# Patient Record
Sex: Female | Born: 1950 | Race: White | Hispanic: No | Marital: Married | Smoking: Former smoker
Health system: Southern US, Community
[De-identification: ages and names within clinical notes are randomized; demographics above are authoritative.]

## PROBLEM LIST (undated history)

## (undated) DIAGNOSIS — K219 Gastro-esophageal reflux disease without esophagitis: Secondary | ICD-10-CM

## (undated) DIAGNOSIS — C4492 Squamous cell carcinoma of skin, unspecified: Secondary | ICD-10-CM

## (undated) DIAGNOSIS — I10 Essential (primary) hypertension: Secondary | ICD-10-CM

## (undated) HISTORY — PX: ABDOMINAL HYSTERECTOMY: SHX81

## (undated) HISTORY — PX: COLONOSCOPY WITH PROPOFOL: SHX5780

---

## 2014-10-01 ENCOUNTER — Ambulatory Visit: Payer: Self-pay | Admitting: Family Medicine

## 2015-04-08 DIAGNOSIS — K219 Gastro-esophageal reflux disease without esophagitis: Secondary | ICD-10-CM | POA: Insufficient documentation

## 2015-04-08 DIAGNOSIS — Z800 Family history of malignant neoplasm of digestive organs: Secondary | ICD-10-CM | POA: Insufficient documentation

## 2015-04-11 ENCOUNTER — Other Ambulatory Visit: Payer: Self-pay | Admitting: Family Medicine

## 2015-04-11 DIAGNOSIS — Z780 Asymptomatic menopausal state: Secondary | ICD-10-CM

## 2015-04-29 ENCOUNTER — Ambulatory Visit
Admission: RE | Admit: 2015-04-29 | Discharge: 2015-04-29 | Disposition: A | Discharge status: Home / Self Care | Payer: Medicare Other | Source: Ambulatory Visit | Attending: Family Medicine | Admitting: Family Medicine

## 2015-04-29 DIAGNOSIS — Z780 Asymptomatic menopausal state: Secondary | ICD-10-CM | POA: Diagnosis present

## 2016-01-06 DIAGNOSIS — R079 Chest pain, unspecified: Secondary | ICD-10-CM | POA: Insufficient documentation

## 2016-03-14 ENCOUNTER — Ambulatory Visit
Admission: EM | Admit: 2016-03-14 | Discharge: 2016-03-14 | Disposition: A | Discharge status: Home / Self Care | Payer: Medicare Other | Attending: Family Medicine | Admitting: Family Medicine

## 2016-03-14 ENCOUNTER — Encounter: Payer: Self-pay | Admitting: Emergency Medicine

## 2016-03-14 ENCOUNTER — Ambulatory Visit: Payer: Medicare Other

## 2016-03-14 DIAGNOSIS — M79602 Pain in left arm: Secondary | ICD-10-CM | POA: Diagnosis not present

## 2016-03-14 DIAGNOSIS — M25552 Pain in left hip: Secondary | ICD-10-CM | POA: Insufficient documentation

## 2016-03-14 DIAGNOSIS — M549 Dorsalgia, unspecified: Secondary | ICD-10-CM | POA: Diagnosis present

## 2016-03-14 DIAGNOSIS — M79605 Pain in left leg: Secondary | ICD-10-CM

## 2016-03-14 DIAGNOSIS — W19XXXA Unspecified fall, initial encounter: Secondary | ICD-10-CM

## 2016-03-14 HISTORY — DX: Essential (primary) hypertension: I10

## 2016-03-14 HISTORY — DX: Gastro-esophageal reflux disease without esophagitis: K21.9

## 2016-03-14 MED ORDER — ORPHENADRINE CITRATE ER 100 MG PO TB12: 100.0000 mg | ORAL_TABLET | Freq: Two times a day (BID) | ORAL | 0 refills | Status: DC

## 2016-03-14 MED ORDER — MELOXICAM 15 MG PO TABS: 15.0000 mg | ORAL_TABLET | Freq: Every day | ORAL | 0 refills | Status: DC
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
## 2017-01-01 ENCOUNTER — Ambulatory Visit
Admission: EM | Admit: 2017-01-01 | Discharge: 2017-01-01 | Disposition: A | Discharge status: Home / Self Care | Payer: Medicare Other | Attending: Registered Nurse | Admitting: Registered Nurse

## 2017-01-01 DIAGNOSIS — L03116 Cellulitis of left lower limb: Secondary | ICD-10-CM

## 2017-01-01 DIAGNOSIS — L03119 Cellulitis of unspecified part of limb: Secondary | ICD-10-CM

## 2017-01-01 HISTORY — DX: Squamous cell carcinoma of skin, unspecified: C44.92

## 2017-01-01 MED ORDER — ACETAMINOPHEN 500 MG PO TABS: 1000.0000 mg | ORAL_TABLET | Freq: Four times a day (QID) | ORAL | 0 refills | Status: DC | PRN

## 2017-01-01 MED ORDER — CEPHALEXIN 500 MG PO CAPS: 500.0000 mg | ORAL_CAPSULE | Freq: Three times a day (TID) | ORAL | 0 refills | Status: DC

## 2017-01-01 NOTE — ED Provider Notes (Signed)
CSN: [id number]     Arrival date & time 01/01/17  1240 History   First MD Initiated Contact with Patient 01/01/17 1316     Chief Complaint  Patient presents with  . Wound Infection   (Consider location/radiation/quality/duration/timing/severity/associated sxs/prior Treatment) 66y/o caucasian female established patient here for evaluation swelling, tenderness left lower extremity that started last night and swelling worsened a great deal overnight.  Patient planning to travel out of town for vacation and [facility] not open weekends.  Has not tried any treatment except to wash with soap and water.  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  Patient reported increased pink/red color to skin.  Denied trauma, insect bites, scratching, itching to affected area.  Calf does not hurt with movement only shin area.  Denied discharge, fever, nausea, vomiting, diarrhea, headache, dsypnea, dysuria, history of seasonal allergies/infections resistent to antibiotics      Past Medical History:  Diagnosis Date  . GERD (gastroesophageal reflux disease)   . Hypertension   . Squamous cell skin cancer    Past Surgical History:  Procedure Laterality Date  . ABDOMINAL HYSTERECTOMY     History reviewed. No pertinent family history. Social History  Substance Use Topics  . Smoking status: Former Smoker    Quit date: 01/02/2007  . Smokeless tobacco: Never Used  . Alcohol use Yes     Comment: couple times weekly   OB History    No data available     Review of Systems  Constitutional: Negative for activity change, appetite change, chills, diaphoresis, fatigue and fever.  HENT: Negative for ear pain and sore throat.   Eyes: Negative for pain and visual disturbance.  Respiratory: Negative for cough, shortness of breath, wheezing and stridor.   Cardiovascular: Positive for leg swelling. Negative for chest pain and palpitations.  Gastrointestinal: Negative for blood in stool and  vomiting.  Endocrine: Negative for cold intolerance and heat intolerance.  Genitourinary: Negative for dysuria and hematuria.  Musculoskeletal: Positive for myalgias. Negative for arthralgias, back pain, gait problem, joint swelling, neck pain and neck stiffness.  Skin: Positive for color change and rash. Negative for pallor and wound.  Allergic/Immunologic: Negative for environmental allergies and food allergies.  Neurological: Negative for tremors, seizures, syncope, weakness and numbness.  Hematological: Negative for adenopathy. Does not bruise/bleed easily.  Psychiatric/Behavioral: Negative for sleep disturbance.  All other systems reviewed and are negative.   Allergies  Patient has no known allergies.  Home Medications   Prior to Admission medications   Medication Sig Start Date End Date Taking? Authorizing Provider  acetaminophen (TYLENOL) 500 MG tablet Take 2 tablets (1,000 mg total) by mouth every 6 (six) hours as needed. 01/01/17   Tanaysia Bhardwaj, Aura Fey, NP  cephALEXin (KEFLEX) 500 MG capsule Take 1 capsule (500 mg total) by mouth 3 (three) times daily. 01/01/17   Clydie Dillen, Aura Fey, NP  lisinopril-hydrochlorothiazide (PRINZIDE,ZESTORETIC) 20-12.5 MG tablet Take 1 tablet by mouth daily.    [provider]  omeprazole (PRILOSEC) 20 MG capsule Take 20 mg by mouth 2 (two) times daily before a meal.    [provider]  orphenadrine (NORFLEX) 100 MG tablet Take 1 tablet (100 mg total) by mouth 2 (two) times daily. 03/14/16   Frederich Cha, MD  pravastatin (PRAVACHOL) 40 MG tablet Take 40 mg by mouth daily.    [provider]   Meds Ordered and Administered this Visit  Medications - No data to display  BP 129/61 (BP Location:  Right Arm)   Pulse 67   Temp 98.4 F (36.9 C) (Oral)   Resp 16   Ht 5\' 5"  (1.651 m)   Wt 130 lb (59 kg)   SpO2 100%   BMI 21.63 kg/m  No data found.   Physical Exam  Constitutional: She is oriented to person, place, and time.  Vital signs are normal. She appears well-developed and well-nourished. She is active and cooperative.  Non-toxic appearance. She does not have a sickly appearance. She does not appear ill. No distress.  HENT:  Head: Normocephalic and atraumatic.  Right Ear: Hearing and external ear normal.  Left Ear: Hearing and external ear normal.  Nose: Nose normal.  Mouth/Throat: Uvula is midline, oropharynx is clear and moist and mucous membranes are normal. No oropharyngeal exudate.  Eyes: Conjunctivae, EOM and lids are normal. Pupils are equal, round, and reactive to light. Right eye exhibits no discharge. Left eye exhibits no discharge. No scleral icterus.  Neck: Trachea normal, normal range of motion and phonation normal. Neck supple. No neck rigidity. No tracheal deviation, no edema and no erythema present.  Cardiovascular: Normal rate, regular rhythm and intact distal pulses.   No murmur heard. Pulses:      Dorsalis pedis pulses are 2+ on the right side, and 2+ on the left side.  Pulmonary/Chest: Effort normal and breath sounds normal. No stridor. No respiratory distress. She has no wheezes.  Abdominal: Soft. There is no tenderness. There is no guarding.  Musculoskeletal:       Right shoulder: Normal.       Left shoulder: Normal.       Right elbow: Normal.      Left elbow: Normal.       Right wrist: Normal.       Left wrist: Normal.       Right hip: Normal.       Left hip: Normal.       Right knee: Normal.       Left knee: Normal.       Right ankle: Normal.       Left ankle: Normal.       Cervical back: Normal.       Thoracic back: Normal.       Right forearm: Normal.       Left forearm: Normal.       Right hand: Normal.       Left hand: Normal.       Right lower leg: Normal.       Left lower leg: She exhibits tenderness, swelling and edema. She exhibits no bony tenderness, no deformity and no laceration.       Legs:      Right foot: Normal.       Left foot: Normal.  1cm erythematous  macular rash anterior left shin 3cm superior to malleolus 0-1+ swelling nonpitting; superiomedial to erythema is localized edema fluctuant TTP 2cm diameter nonerythematous; scar noted central of erythema flat hypopigmented well healed  Neurological: She is alert and oriented to person, place, and time. She has normal strength. She is not disoriented. She displays no atrophy, no tremor and normal reflexes. No cranial nerve deficit or sensory deficit. She exhibits normal muscle tone. She displays no seizure activity. Coordination and gait normal. GCS eye subscore is 4. GCS verbal subscore is 5. GCS motor subscore is 6.  Gait sure and steady in hall; strength extremities 5/5 bilaterally  Skin: Skin is warm and dry. Capillary refill takes less than  2 seconds. Rash noted. No abrasion, no bruising, no burn, no ecchymosis, no laceration, no lesion, no petechiae and no purpura noted. Rash is macular. Rash is not papular, not maculopapular, not nodular, not pustular, not vesicular and not urticarial. She is not diaphoretic. There is erythema. No cyanosis. No pallor. Nails show no clubbing.     Psychiatric: She has a normal mood and affect. Her speech is normal and behavior is normal. Judgment and thought content normal. She is not actively hallucinating. Cognition and memory are normal. She is attentive.  Nursing note and vitals reviewed.   Urgent Care Course     Procedures (including critical care time)  Labs Review Labs Reviewed - No data to display  Imaging Review No results found.    MDM   1. Cellulitis of lower extremity, unspecified laterality    Keflex 500mg  po TID x 7 days extend to 14 days if all swelling/erythema has not resolved with 7 days but improving with treatment.  Elevate left leg when sitting.  May apply ice 15 minutes QID prn pain/swelling.  Consider compression.  Will treat for cellulitis.  Exitcare handout on skin infection given to patient.  RTC if worsening erythema, pain,  purulent discharge, fever after 48 hours on antibiotics or if overnight redness spreads past knee go to ER as may need IV antibiotics.  Wash towels, washcloths, sheets in hot water with bleach every couple of days until infection resolved.  Avoid soaking limb in dirty water e.g. Muddy/rivers/streams until infection healed.  Discussed signs and symptoms of blood clots with patient e.g. Calf pain, knotty swelling, pain along blood vessel.  Patient verbalized understanding, agreed with plan of care and had no further questions at this time.      Olen Cordial, NP 01/01/17 1430

## 2017-01-01 NOTE — ED Triage Notes (Signed)
Pt reports squamous cell surgery to her left shin. Healing well until yesterday when she noticed redness around the site which seems to be moving up the leg.

## 2017-01-28 ENCOUNTER — Other Ambulatory Visit: Payer: Self-pay | Admitting: Family Medicine

## 2017-01-28 DIAGNOSIS — Z1239 Encounter for other screening for malignant neoplasm of breast: Secondary | ICD-10-CM

## 2017-01-28 DIAGNOSIS — Z780 Asymptomatic menopausal state: Secondary | ICD-10-CM

## 2017-05-02 ENCOUNTER — Ambulatory Visit
Admission: RE | Admit: 2017-05-02 | Discharge: 2017-05-02 | Disposition: A | Discharge status: Home / Self Care | Payer: Medicare Other | Source: Ambulatory Visit | Attending: Family Medicine | Admitting: Family Medicine

## 2017-05-02 DIAGNOSIS — Z780 Asymptomatic menopausal state: Secondary | ICD-10-CM | POA: Insufficient documentation

## 2017-05-02 DIAGNOSIS — Z1231 Encounter for screening mammogram for malignant neoplasm of breast: Secondary | ICD-10-CM | POA: Diagnosis present

## 2017-05-02 DIAGNOSIS — M810 Age-related osteoporosis without current pathological fracture: Secondary | ICD-10-CM | POA: Diagnosis not present

## 2017-05-02 DIAGNOSIS — Z1239 Encounter for other screening for malignant neoplasm of breast: Secondary | ICD-10-CM

## 2017-09-28 DIAGNOSIS — Z87891 Personal history of nicotine dependence: Secondary | ICD-10-CM | POA: Insufficient documentation

## 2017-09-28 DIAGNOSIS — R911 Solitary pulmonary nodule: Secondary | ICD-10-CM | POA: Insufficient documentation

## 2018-04-03 ENCOUNTER — Other Ambulatory Visit: Payer: Self-pay | Admitting: Family Medicine

## 2018-04-03 DIAGNOSIS — Z1231 Encounter for screening mammogram for malignant neoplasm of breast: Secondary | ICD-10-CM

## 2018-05-04 ENCOUNTER — Ambulatory Visit
Admission: RE | Admit: 2018-05-04 | Discharge: 2018-05-04 | Disposition: A | Discharge status: Home / Self Care | Payer: Medicare Other | Source: Ambulatory Visit | Attending: Family Medicine | Admitting: Family Medicine

## 2018-05-04 ENCOUNTER — Encounter (INDEPENDENT_AMBULATORY_CARE_PROVIDER_SITE_OTHER): Payer: Self-pay

## 2018-05-04 DIAGNOSIS — Z1231 Encounter for screening mammogram for malignant neoplasm of breast: Secondary | ICD-10-CM

## 2018-08-19 ENCOUNTER — Ambulatory Visit
Admission: EM | Admit: 2018-08-19 | Discharge: 2018-08-19 | Disposition: A | Discharge status: Home / Self Care | Payer: Medicare Other | Attending: Emergency Medicine | Admitting: Emergency Medicine

## 2018-08-19 ENCOUNTER — Other Ambulatory Visit: Payer: Self-pay

## 2018-08-19 ENCOUNTER — Encounter: Payer: Self-pay | Admitting: Emergency Medicine

## 2018-08-19 DIAGNOSIS — B373 Candidiasis of vulva and vagina: Secondary | ICD-10-CM | POA: Diagnosis present

## 2018-08-19 DIAGNOSIS — R310 Gross hematuria: Secondary | ICD-10-CM | POA: Diagnosis present

## 2018-08-19 DIAGNOSIS — B3731 Acute candidiasis of vulva and vagina: Secondary | ICD-10-CM

## 2018-08-19 LAB — WET PREP, GENITAL
Clue Cells Wet Prep HPF POC: NONE SEEN
Sperm: NONE SEEN
Trich, Wet Prep: NONE SEEN

## 2018-08-19 LAB — URINALYSIS, COMPLETE (UACMP) WITH MICROSCOPIC
BILIRUBIN URINE: NEGATIVE
Bacteria, UA: NONE SEEN
GLUCOSE, UA: NEGATIVE mg/dL
KETONES UR: NEGATIVE mg/dL
LEUKOCYTES UA: NEGATIVE
Nitrite: NEGATIVE
PROTEIN: NEGATIVE mg/dL
Specific Gravity, Urine: 1.015 (ref 1.005–1.030)
pH: 7 (ref 5.0–8.0)

## 2018-08-19 MED ORDER — FLUCONAZOLE 150 MG PO TABS: 150.0000 mg | ORAL_TABLET | Freq: Once | ORAL | 1 refills | Status: AC

## 2018-08-19 MED ORDER — PHENAZOPYRIDINE HCL 200 MG PO TABS: 200.0000 mg | ORAL_TABLET | Freq: Three times a day (TID) | ORAL | 0 refills | Status: DC | PRN

## 2018-08-19 NOTE — ED Triage Notes (Signed)
Patient c/o burning when urinating that started yesterday.  Patient reports some blood on her toilet paper after she wiped.

## 2018-08-19 NOTE — ED Provider Notes (Signed)
HPI  SUBJECTIVE:  Lori Dougherty is a 68 y.o. female who presents with 3 weeks of burning dysuria, frequency, odorous urine.  She notes gross hematuria starting yesterday with the first urine in the morning.  She reports intermittent midline pelvic pain described as cramping, discomfort.  Pain is not associated with urination.  She saw her PMD 3 weeks ago for this on 1/13, she had trace blood in her urine, but no gross hematuria.  She was treated with 3 days of Macrobid and her urine culture came back negative.  The Macrobid helped temporarily.  No aggravating factors.  She denies nausea, vomiting, fevers, abdominal, back pain.  No vaginal bleeding, odor, discharge, labial swelling, vaginal itching, irritation.  She did get a labial biopsy on 1/28 without any complications.  She is sexually active with her husband of 32 years who is asymptomatic.  STDs are not a concern today.  She is a former smoker and is status post a hysterectomy.  She has a history of UTIs, hypertension, vaginal dryness she has not filled topical estrogen yet.  No history of bladder cancer, pyelonephritis, nephrolithiasis, diabetes.  Family history significant for mother with nephrolithiasis.  CVE:LFYBOF, Frederic Jericho, MD   Past Medical History:  Diagnosis Date  . GERD (gastroesophageal reflux disease)   . Hypertension   . Squamous cell skin cancer     Past Surgical History:  Procedure Laterality Date  . ABDOMINAL HYSTERECTOMY      Family History  Problem Relation Age of Onset  . Breast cancer Neg Hx     Social History   Tobacco Use  . Smoking status: Former Smoker    Last attempt to quit: 01/02/2007    Years since quitting: 11.6  . Smokeless tobacco: Never Used  Substance Use Topics  . Alcohol use: Yes    Comment: couple times weekly  . Drug use: No    No current facility-administered medications for this encounter.   Current Outpatient Medications:  .  lisinopril-hydrochlorothiazide  (PRINZIDE,ZESTORETIC) 20-12.5 MG tablet, Take 1 tablet by mouth daily., Disp: , Rfl:  .  omeprazole (PRILOSEC) 20 MG capsule, Take 20 mg by mouth 2 (two) times daily before a meal., Disp: , Rfl:  .  pravastatin (PRAVACHOL) 40 MG tablet, Take 40 mg by mouth daily., Disp: , Rfl:  .  acetaminophen (TYLENOL) 500 MG tablet, Take 2 tablets (1,000 mg total) by mouth every 6 (six) hours as needed., Disp: 30 tablet, Rfl: 0 .  fluconazole (DIFLUCAN) 150 MG tablet, Take 1 tablet (150 mg total) by mouth once for 1 dose. 1 tab po x 1. May repeat in 72 hours if no improvement, Disp: 2 tablet, Rfl: 1 .  phenazopyridine (PYRIDIUM) 200 MG tablet, Take 1 tablet (200 mg total) by mouth 3 (three) times daily as needed for pain., Disp: 6 tablet, Rfl: 0  No Known Allergies   ROS  As noted in HPI.   Physical Exam  BP 99/63 (BP Location: Left Arm)   Pulse 76   Temp 97.9 F (36.6 C) (Oral)   Resp 14   Ht 5' 5.5" (1.664 m)   Wt 59 kg   SpO2 100%   BMI 21.30 kg/m   Constitutional: Well developed, well nourished, no acute distress Eyes:  EOMI, conjunctiva normal bilaterally HENT: Normocephalic, atraumatic,mucus membranes moist Respiratory: Normal inspiratory effort Cardiovascular: Normal rate GI: nondistended, active bowel sounds.  Positive suprapubic and mild bilateral lower quadrant tenderness.  No flank tenderness.  No guarding, rebound.  Negative tap table test. Back: No CVAT GU: Healing biopsy wound left external labia.  No active bleeding, crusting surrounding erythema, edema.  Normal urethra.  Normal vaginal mucosa.  Vaginal cuff intact.  Positive white vaginal d/c in the vaginal vault.   Skin: No rash, skin intact Musculoskeletal: no deformities Neurologic: Alert & oriented x 3, no focal neuro deficits Psychiatric: Speech and behavior appropriate   ED Course   Medications - No data to display  Orders Placed This Encounter  Procedures  . Pelvic exam    Standing Status:   Standing     Number of Occurrences:   1  . Urine culture    Standing Status:   Standing    Number of Occurrences:   1    Order Specific Question:   List patient's active antibiotics    Answer:   none  . Wet prep, genital    Standing Status:   Standing    Number of Occurrences:   1  . Urinalysis, Complete w Microscopic    Standing Status:   Standing    Number of Occurrences:   1    Results for orders placed or performed during the hospital encounter of 08/19/18 (from the past 24 hour(s))  Urinalysis, Complete w Microscopic     Status: Abnormal   Collection Time: 08/19/18  9:43 AM  Result Value Ref Range   Color, Urine STRAW (A) YELLOW   APPearance CLEAR CLEAR   Specific Gravity, Urine 1.015 1.005 - 1.030   pH 7.0 5.0 - 8.0   Glucose, UA NEGATIVE NEGATIVE mg/dL   Hgb urine dipstick MODERATE (A) NEGATIVE   Bilirubin Urine NEGATIVE NEGATIVE   Ketones, ur NEGATIVE NEGATIVE mg/dL   Protein, ur NEGATIVE NEGATIVE mg/dL   Nitrite NEGATIVE NEGATIVE   Leukocytes, UA NEGATIVE NEGATIVE   Squamous Epithelial / LPF 0-5 0 - 5   WBC, UA 0-5 0 - 5 WBC/hpf   RBC / HPF 11-20 0 - 5 RBC/hpf   Bacteria, UA NONE SEEN NONE SEEN  Wet prep, genital     Status: Abnormal   Collection Time: 08/19/18 10:30 AM  Result Value Ref Range   Yeast Wet Prep HPF POC PRESENT (A) NONE SEEN   Trich, Wet Prep NONE SEEN NONE SEEN   Clue Cells Wet Prep HPF POC NONE SEEN NONE SEEN   WBC, Wet Prep HPF POC FEW (A) NONE SEEN   Sperm NONE SEEN    No results found.  ED Clinical Impression  Gross hematuria  Vaginal yeast infection   ED Assessment/Plan  Care Everywhere records, labs reviewed.  As noted in HPI.  UA today negative for bacteria, nitrite, esterase, positive for moderate blood which is a change from the UA 3 weeks ago done in her PMDs office which showed trace blood.  Will send this off for culture to confirm absence of UTI.  checking wet prep for BV and yeast as this could explain her dysuria.  Vaginal dryness  also in the differential.    Unsure as to the etiology of her hematuria, doubt obstructing nephrolithiasis.  Bladder cancer in the differential given history of smoking.  Will refer to urology. Dr. Bernardo Heater on call.   Wet Prep positive for yeast.  Negative for BV. Home with Diflucan.  Also Pyridium for symptom control.  Call patient if urine culture comes back positive for UTI will call in the appropriate antibiotics at that time.  Discussed labs,MDM, treatment plan, and plan for follow-up with patient. Discussed  sn/sx that should prompt return to the ED. patient agrees with plan.   Meds ordered this encounter  Medications  . fluconazole (DIFLUCAN) 150 MG tablet    Sig: Take 1 tablet (150 mg total) by mouth once for 1 dose. 1 tab po x 1. May repeat in 72 hours if no improvement    Dispense:  2 tablet    Refill:  1  . phenazopyridine (PYRIDIUM) 200 MG tablet    Sig: Take 1 tablet (200 mg total) by mouth 3 (three) times daily as needed for pain.    Dispense:  6 tablet    Refill:  0    *This clinic note was created using [employer]. Therefore, there may be occasional mistakes despite careful proofreading.   ?    Melynda Ripple, MD 08/19/18 1746

## 2018-08-19 NOTE — Discharge Instructions (Signed)
Your wet prep was positive for yeast, negative for bacterial vaginosis.  Yeast infection could explain the burning dysuria that you are having.  I am going to treat you with Diflucan.  We will also treat you with Pyridium which may help control your symptoms.  It will turn your urine bright orange.  Continue pushing plenty of fluids.  Follow-up with urology as soon as you possibly can.  Go to the ER for the signs and symptoms we discussed.

## 2018-08-21 ENCOUNTER — Telehealth (HOSPITAL_COMMUNITY): Payer: Self-pay | Admitting: Emergency Medicine

## 2018-08-21 LAB — URINE CULTURE: Culture: NO GROWTH

## 2018-08-21 NOTE — Telephone Encounter (Signed)
Candida (yeast) is positive.  Prescription for fluconazole was given at the urgent care visit.  Urine culture did not suggest a UTI.  Recheck or followup with PCP for further evaluation if symptoms are not improving

## 2018-08-23 ENCOUNTER — Ambulatory Visit (INDEPENDENT_AMBULATORY_CARE_PROVIDER_SITE_OTHER): Payer: Medicare Other | Admitting: Urology

## 2018-08-23 ENCOUNTER — Encounter: Payer: Self-pay | Admitting: Urology

## 2018-08-23 VITALS — BP 151/69 | HR 66 | Ht 65.0 in | Wt 129.6 lb

## 2018-08-23 DIAGNOSIS — L409 Psoriasis, unspecified: Secondary | ICD-10-CM | POA: Insufficient documentation

## 2018-08-23 DIAGNOSIS — I10 Essential (primary) hypertension: Secondary | ICD-10-CM | POA: Insufficient documentation

## 2018-08-23 DIAGNOSIS — E785 Hyperlipidemia, unspecified: Secondary | ICD-10-CM | POA: Insufficient documentation

## 2018-08-23 DIAGNOSIS — R310 Gross hematuria: Secondary | ICD-10-CM | POA: Insufficient documentation

## 2018-08-23 LAB — URINALYSIS, COMPLETE
BILIRUBIN UA: NEGATIVE
Glucose, UA: NEGATIVE
Ketones, UA: NEGATIVE
Nitrite, UA: NEGATIVE
PH UA: 7 (ref 5.0–7.5)
Protein, UA: NEGATIVE
Specific Gravity, UA: 1.015 (ref 1.005–1.030)
UUROB: 0.2 mg/dL (ref 0.2–1.0)

## 2018-08-23 LAB — MICROSCOPIC EXAMINATION: WBC UA: NONE SEEN /HPF (ref 0–5)

## 2018-08-23 NOTE — Progress Notes (Signed)
08/23/2018 10:33 AM   Lori Dougherty [birth year] [id number]  Referring provider: Clarisse Gouge, MD [address] [place], [place] [address]  Chief Complaint  Patient presents with  . Hematuria    HPI: 68 year old female seen at a [facility] facility on 08/19/2018 with a 1 day history of gross hematuria.  She states her urine was bloody red for 3 days and then for several days her initial morning void was tea colored.  She denied flank, abdominal or pelvic pain.  She noted urinary frequency and mild urgency but denied dysuria, bladder pressure.  Urinalysis at her visit showed 11-20 RBCs.  A urine culture was negative.  She denies previous history of gross hematuria or prior urologic problems/evaluation.  She denies the use of anticoagulant/antiplatelet medication.   PMH: Past Medical History:  Diagnosis Date  . GERD (gastroesophageal reflux disease)   . Hypertension   . Squamous cell skin cancer     Surgical History: Past Surgical History:  Procedure Laterality Date  . ABDOMINAL HYSTERECTOMY      Home Medications:  Allergies as of 08/23/2018   No Known Allergies     Medication List       Accurate as of August 23, 2018 10:33 AM. Always use your most recent med list.        acetaminophen 500 MG tablet Commonly known as:  TYLENOL Take 2 tablets (1,000 mg total) by mouth every 6 (six) hours as needed.   acyclovir 200 MG capsule Commonly known as:  ZOVIRAX Take by mouth.   estradiol 0.1 MG/GM vaginal cream Commonly known as:  ESTRACE Insert pea size amount vaginally nightly x 2 weeks, then every other night x 2 weeks, then twice weekly for maintenance   lisinopril-hydrochlorothiazide 20-12.5 MG tablet Commonly known as:  PRINZIDE,ZESTORETIC Take 1 tablet by mouth daily.   omeprazole 20 MG capsule Commonly known as:  PRILOSEC Take 20 mg by mouth 2 (two) times daily before a meal.   phenazopyridine 200 MG  tablet Commonly known as:  PYRIDIUM Take 1 tablet (200 mg total) by mouth 3 (three) times daily as needed for pain.   pravastatin 40 MG tablet Commonly known as:  PRAVACHOL Take 40 mg by mouth daily.   raloxifene 60 MG tablet Commonly known as:  EVISTA TK 1 T PO ONCE D FOR OSTEOPOROSIS   Saline Gel Apply topically.   traZODone 50 MG tablet Commonly known as:  DESYREL Take by mouth.   Vitamin D3 25 MCG (1000 UT) Caps Take by mouth.       Allergies: No Known Allergies  Family History: Family History  Problem Relation Age of Onset  . Breast cancer Neg Hx     Social History:  reports that she quit smoking about 11 years ago. She has never used smokeless tobacco. She reports current alcohol use. She reports that she does not use drugs.  ROS: UROLOGY Frequent Urination?: No Hard to postpone urination?: No Burning/pain with urination?: Yes Get up at night to urinate?: No Leakage of urine?: No Urine stream starts and stops?: No Trouble starting stream?: No Do you have to strain to urinate?: No Blood in urine?: Yes Urinary tract infection?: No Sexually transmitted disease?: No Injury to kidneys or bladder?: No Painful intercourse?: No Weak stream?: No Currently pregnant?: No Vaginal bleeding?: No Last menstrual period?: n  Gastrointestinal Nausea?: No Vomiting?: No Indigestion/heartburn?: No Diarrhea?: No Constipation?: No  Constitutional Fever: No Night sweats?: No Weight loss?:  No Fatigue?: No  Skin Skin rash/lesions?: No Itching?: No  Eyes Blurred vision?: No Double vision?: No  Ears/Nose/Throat Sore throat?: No Sinus problems?: No  Hematologic/Lymphatic Swollen glands?: No Easy bruising?: No  Cardiovascular Leg swelling?: No Chest pain?: No  Respiratory Cough?: No Shortness of breath?: No  Endocrine Excessive thirst?: No  Musculoskeletal Back pain?: No Joint pain?: No  Neurological Headaches?: No Dizziness?:  No  Psychologic Depression?: No Anxiety?: No  Physical Exam: BP (!) 151/69 (BP Location: Left Arm, Patient Position: Sitting, Cuff Size: Normal)   Pulse 66   Ht 5\' 5"  (1.651 m)   Wt 129 lb 9.6 oz (58.8 kg)   BMI 21.57 kg/m   Constitutional:  Alert and oriented, No acute distress. HEENT: [place] AT, moist mucus membranes.  Trachea midline, no masses. Cardiovascular: No clubbing, cyanosis, or edema. Respiratory: Normal respiratory effort, no increased work of breathing. GI: Abdomen is soft, nontender, nondistended, no abdominal masses GU: No CVA tenderness Lymph: No cervical or inguinal lymphadenopathy. Skin: No rashes, bruises or suspicious lesions. Neurologic: Grossly intact, no focal deficits, moving all 4 extremities. Psychiatric: Normal mood and affect.  Laboratory Data:  Urinalysis Dipstick 1+ blood, trace leukocytes Microscopy 3-10 RBC   Assessment & Plan:   68 year old female with a recent episode of total gross painless hematuria and persistent microhematuria on today's urinalysis.  We discussed potential etiologies including both benign and malignant pathology.  I recommended a full hematuria evaluation to include CT urogram and cystoscopy.  She is traveling to [place] on 2/15 and will try and expedite these studies prior to that time.   Abbie Sons, [place] [address] [place], [place] [address] [phone]

## 2018-08-25 ENCOUNTER — Other Ambulatory Visit: Payer: Self-pay | Admitting: Urology

## 2018-08-25 ENCOUNTER — Ambulatory Visit
Admission: EM | Admit: 2018-08-25 | Discharge: 2018-08-25 | Disposition: A | Discharge status: Home / Self Care | Payer: Medicare Other | Attending: Family Medicine | Admitting: Family Medicine

## 2018-08-25 ENCOUNTER — Other Ambulatory Visit: Payer: Self-pay

## 2018-08-25 DIAGNOSIS — R05 Cough: Secondary | ICD-10-CM | POA: Diagnosis not present

## 2018-08-25 DIAGNOSIS — R0981 Nasal congestion: Secondary | ICD-10-CM

## 2018-08-25 DIAGNOSIS — R6883 Chills (without fever): Secondary | ICD-10-CM | POA: Diagnosis not present

## 2018-08-25 DIAGNOSIS — J0100 Acute maxillary sinusitis, unspecified: Secondary | ICD-10-CM

## 2018-08-25 DIAGNOSIS — Z87891 Personal history of nicotine dependence: Secondary | ICD-10-CM | POA: Diagnosis not present

## 2018-08-25 DIAGNOSIS — J209 Acute bronchitis, unspecified: Secondary | ICD-10-CM

## 2018-08-25 MED ORDER — DOXYCYCLINE HYCLATE 100 MG PO CAPS: 100.0000 mg | ORAL_CAPSULE | Freq: Two times a day (BID) | ORAL | 0 refills | Status: DC

## 2018-08-25 NOTE — ED Provider Notes (Signed)
[facility] ____________________________________________  Time seen: Approximately 11:27 AM  I have reviewed the triage vital signs and the nursing notes.   HISTORY  Chief Complaint Cough (APPT)   HPI Lori Dougherty is a 68 y.o. female presenting for evaluation of 2 weeks of nasal congestion, postnasal drainage and cough.  States feels like cough has worsened in the last few days as well as having sinus pressure.  Also having chills for the last 2 days but denies known fever.  Overall continues to eat and drink well.  Has been recently with similar complaints.  Denies shortness of breath or chest pain.  States does have intermittent chest tightness and heaviness with the congestion.  Unresolved with over-the-counter cough and congestion medication.  Has continued to remain active.  States doing well denies other complaints.  Clarisse Gouge, MD: PCP   Past Medical History:  Diagnosis Date  . GERD (gastroesophageal reflux disease)   . Hypertension   . Squamous cell skin cancer     Patient Active Problem List   Diagnosis Date Noted  . Hyperlipidemia 08/23/2018  . Hypertension 08/23/2018  . Psoriasis 08/23/2018  . Gross hematuria 08/23/2018  . Personal history of tobacco use 09/28/2017  . Pulmonary nodule, left 09/28/2017  . Chest pain 01/06/2016  . Family history of colon cancer 04/08/2015  . Gastroesophageal reflux disease 04/08/2015    Past Surgical History:  Procedure Laterality Date  . ABDOMINAL HYSTERECTOMY       No current facility-administered medications for this encounter.   Current Outpatient Medications:  .  acyclovir (ZOVIRAX) 200 MG capsule, Take by mouth., Disp: , Rfl:  .  Cholecalciferol (VITAMIN D3) 25 MCG (1000 UT) CAPS, Take by mouth., Disp: , Rfl:  .  estradiol (ESTRACE) 0.1 MG/GM vaginal cream, Insert pea size amount vaginally nightly x 2 weeks, then every other night x 2 weeks, then twice weekly for maintenance, Disp: , Rfl:    .  lisinopril-hydrochlorothiazide (PRINZIDE,ZESTORETIC) 20-12.5 MG tablet, Take 1 tablet by mouth daily., Disp: , Rfl:  .  omeprazole (PRILOSEC) 20 MG capsule, Take 20 mg by mouth 2 (two) times daily before a meal., Disp: , Rfl:  .  pravastatin (PRAVACHOL) 40 MG tablet, Take 40 mg by mouth daily., Disp: , Rfl:  .  raloxifene (EVISTA) 60 MG tablet, TK 1 T PO ONCE D FOR OSTEOPOROSIS, Disp: , Rfl:  .  Saline GEL, Apply topically., Disp: , Rfl:  .  traZODone (DESYREL) 50 MG tablet, Take by mouth., Disp: , Rfl:  .  doxycycline (VIBRAMYCIN) 100 MG capsule, Take 1 capsule (100 mg total) by mouth 2 (two) times daily., Disp: 20 capsule, Rfl: 0  Allergies Patient has no known allergies.  Family History  Problem Relation Age of Onset  . Breast cancer Neg Hx     Social History Social History   Tobacco Use  . Smoking status: Former Smoker    Last attempt to quit: 01/02/2007    Years since quitting: 11.6  . Smokeless tobacco: Never Used  Substance Use Topics  . Alcohol use: Yes    Comment: couple times weekly  . Drug use: No    Review of Systems Constitutional: Positive chills.  Denies known fevers. ENT: as above.  Cardiovascular: Denies chest pain. Respiratory: Denies shortness of breath. Gastrointestinal: No abdominal pain.   Musculoskeletal: Negative for back pain. Skin: Negative for rash.   ____________________________________________   PHYSICAL EXAM:  VITAL SIGNS: ED Triage Vitals  Enc Vitals Group  BP 08/25/18 1051 (!) 115/48     Pulse Rate 08/25/18 1051 74     Resp 08/25/18 1051 18     Temp 08/25/18 1051 98.3 F (36.8 C)     Temp Source 08/25/18 1051 Oral     SpO2 08/25/18 1051 100 %     Weight 08/25/18 1048 129 lb 9.6 oz (58.8 kg)     Height 08/25/18 1048 5\' 5"  (1.651 m)     Head Circumference --      Peak Flow --      Pain Score 08/25/18 1048 5     Pain Loc --      Pain Edu? --      Excl. in [place]? --     Constitutional: Alert and oriented. Well appearing  and in no acute distress. Eyes: Conjunctivae are normal.  Head: Atraumatic.Mild to moderate tenderness to palpation bilateral maxillary sinuses.  Mild bilateral frontal sinus tenderness palpation.  No swelling. No erythema.   Ears: no erythema, normal TMs bilaterally.   Nose: nasal congestion with bilateral nasal turbinate erythema and edema.   Mouth/Throat: Mucous membranes are moist.  Oropharynx non-erythematous.No tonsillar swelling or exudate.  Neck: No stridor.  No cervical spine tenderness to palpation. Hematological/Lymphatic/Immunilogical: No cervical lymphadenopathy. Cardiovascular: Normal rate, regular rhythm. Grossly normal heart sounds.  Good peripheral circulation. Respiratory: Normal respiratory effort.  No retractions.No wheezes, rales or rhonchi. Good air movement.  Dry intermittent cough noted. Musculoskeletal: Steady gait. Neurologic:  Normal speech and language. No gross focal neurologic deficits are appreciated. No gait instability. Skin:  Skin is warm, dry and intact. No rash noted. Psychiatric: Mood and affect are normal. Speech and behavior are normal.  ___________________________________________   LABS (all labs ordered are listed, but only abnormal results are displayed)  Labs Reviewed - No data to display   PROCEDURES Procedures    INITIAL IMPRESSION / ASSESSMENT AND PLAN / ED COURSE  Pertinent labs & imaging results that were available during my care of the patient were reviewed by me and considered in my medical decision making (see chart for details).  Well-appearing patient.  No acute distress.  Suspect recent viral upper respiratory infection with secondary sinusitis and bronchitis.  Will treat with oral doxycycline.  Patient declined need for cough medication.  Encourage rest, fluids, supportive care.  Discussed follow up with Primary care physician this week. Discussed follow up and return parameters including no resolution or any worsening concerns.  Patient verbalized understanding and agreed to plan.   ____________________________________________   FINAL CLINICAL IMPRESSION(S) / ED DIAGNOSES  Final diagnoses:  Acute maxillary sinusitis, recurrence not specified  Acute bronchitis, unspecified organism     ED Discharge Orders         Ordered    doxycycline (VIBRAMYCIN) 100 MG capsule  2 times daily     08/25/18 1112           Note: This dictation was prepared with Dragon dictation along with smaller phrase technology. Any transcriptional errors that result from this process are unintentional.         Marylene Land, NP 08/25/18 1132

## 2018-08-25 NOTE — ED Triage Notes (Signed)
Patient states that she had a cold two weeks ago with coughing. States that she has not improved like she thought she would. States that symptoms have seem to persist and worsen. States that she has noticed facial pain and pressure.

## 2018-08-25 NOTE — Discharge Instructions (Addendum)
Take medication as prescribed. Rest. Drink plenty of fluids.  ° °Follow up with your primary care physician this week as needed. Return to Urgent care for new or worsening concerns.  ° °

## 2018-08-29 ENCOUNTER — Ambulatory Visit: Payer: Self-pay | Admitting: Urology

## 2018-08-30 ENCOUNTER — Ambulatory Visit
Admission: RE | Admit: 2018-08-30 | Discharge: 2018-08-30 | Disposition: A | Discharge status: Home / Self Care | Payer: Medicare Other | Source: Ambulatory Visit | Attending: Urology | Admitting: Urology

## 2018-08-30 DIAGNOSIS — R310 Gross hematuria: Secondary | ICD-10-CM | POA: Diagnosis not present

## 2018-08-30 LAB — POCT I-STAT CREATININE: Creatinine, Ser: 0.8 mg/dL (ref 0.44–1.00)

## 2018-08-30 MED ORDER — IOPAMIDOL (ISOVUE-300) INJECTION 61%
100.0000 mL | Freq: Once | INTRAVENOUS | Status: AC | PRN
  Administered 2018-08-30: 100 mL via INTRAVENOUS

## 2018-09-01 ENCOUNTER — Ambulatory Visit (INDEPENDENT_AMBULATORY_CARE_PROVIDER_SITE_OTHER): Payer: Medicare Other | Admitting: Urology

## 2018-09-01 ENCOUNTER — Encounter: Payer: Self-pay | Admitting: Urology

## 2018-09-01 VITALS — BP 138/75 | HR 75 | Ht 65.0 in | Wt 129.4 lb

## 2018-09-01 DIAGNOSIS — R310 Gross hematuria: Secondary | ICD-10-CM

## 2018-09-01 LAB — URINALYSIS, COMPLETE
Bilirubin, UA: NEGATIVE
GLUCOSE, UA: NEGATIVE
Ketones, UA: NEGATIVE
LEUKOCYTES UA: NEGATIVE
Nitrite, UA: NEGATIVE
PROTEIN UA: NEGATIVE
Specific Gravity, UA: 1.02 (ref 1.005–1.030)
UUROB: 0.2 mg/dL (ref 0.2–1.0)
pH, UA: 7 (ref 5.0–7.5)

## 2018-09-01 LAB — MICROSCOPIC EXAMINATION
Bacteria, UA: NONE SEEN
Epithelial Cells (non renal): NONE SEEN /hpf (ref 0–10)
WBC UA: NONE SEEN /HPF (ref 0–5)

## 2018-09-01 NOTE — Progress Notes (Signed)
   09/01/18  CC:  Chief Complaint  Patient presents with  . Cysto    HPI: Refer to my office note of 08/23/2018.  Denies recurrent hematuria. CTU performed on 08/30/2018 showed a 1.6 cm simple cyst of the left kidney.  No upper tract abnormalities were noted.  She was incidentally noted to have a 5 mm pulmonary nodule which she states is currently being monitored.  Blood pressure 138/75, pulse 75, height 5\' 5"  (1.651 m), weight 129 lb 6.4 oz (58.7 kg). NED. A&Ox3.   No respiratory distress     Cystoscopy Procedure Note  Patient identification was confirmed, informed consent was obtained, and patient was prepped using Betadine solution.  Lidocaine jelly was administered per urethral meatus.    Procedure: - Flexible cystoscope introduced, without any difficulty.   - Thorough search of the bladder revealed:    normal urethral meatus    normal urothelium    no stones    no ulcers     no tumors    no urethral polyps    no trabeculation  - Ureteral orifices were normal in position and appearance.  Post-Procedure: - Patient tolerated the procedure well  Assessment/ Plan: No definite source of her hematuria is identified however cystoscopy and CTU negative.  Urine cytology was ordered and she will be notified with results.  She is instructed to call for recurrent gross hematuria otherwise recommend 37-month follow-up.   Abbie Sons, MD

## 2018-09-06 ENCOUNTER — Other Ambulatory Visit: Payer: Self-pay | Admitting: Urology

## 2018-09-06 DIAGNOSIS — R3129 Other microscopic hematuria: Secondary | ICD-10-CM

## 2018-09-07 ENCOUNTER — Telehealth: Payer: Self-pay | Admitting: Urology

## 2018-09-07 NOTE — Progress Notes (Signed)
Urine cytology showed no malignant or atypical cells.  She did have red blood cells in the urine that the possibility of a bleeding source from the filtering part of the kidney.  Would recommend a nephrology evaluation at [facility].  An order was placed.

## 2018-09-07 NOTE — Progress Notes (Addendum)
Patient notified and voiced understanding.

## 2018-09-07 NOTE — Telephone Encounter (Signed)
[place] She was given her results over the phone   [place]

## 2019-02-21 ENCOUNTER — Ambulatory Visit: Payer: Medicare Other | Admitting: Urology

## 2019-03-09 ENCOUNTER — Other Ambulatory Visit: Payer: Self-pay | Admitting: Family Medicine

## 2019-03-09 DIAGNOSIS — Z780 Asymptomatic menopausal state: Secondary | ICD-10-CM

## 2019-03-09 DIAGNOSIS — Z1231 Encounter for screening mammogram for malignant neoplasm of breast: Secondary | ICD-10-CM

## 2019-05-07 ENCOUNTER — Ambulatory Visit: Payer: Medicare Other

## 2019-05-07 ENCOUNTER — Other Ambulatory Visit: Payer: Medicare Other

## 2019-05-08 ENCOUNTER — Ambulatory Visit
Admission: RE | Admit: 2019-05-08 | Discharge: 2019-05-08 | Disposition: A | Discharge status: Home / Self Care | Payer: Medicare Other | Source: Ambulatory Visit | Attending: Family Medicine | Admitting: Family Medicine

## 2019-05-08 ENCOUNTER — Other Ambulatory Visit: Payer: Self-pay

## 2019-05-08 DIAGNOSIS — Z13820 Encounter for screening for osteoporosis: Secondary | ICD-10-CM | POA: Diagnosis not present

## 2019-05-08 DIAGNOSIS — Z1231 Encounter for screening mammogram for malignant neoplasm of breast: Secondary | ICD-10-CM | POA: Insufficient documentation

## 2019-05-08 DIAGNOSIS — Z780 Asymptomatic menopausal state: Secondary | ICD-10-CM | POA: Insufficient documentation

## 2019-10-10 ENCOUNTER — Ambulatory Visit
Admission: EM | Admit: 2019-10-10 | Discharge: 2019-10-10 | Disposition: A | Discharge status: Home / Self Care | Payer: Medicare Other | Attending: Family Medicine | Admitting: Family Medicine

## 2019-10-10 ENCOUNTER — Other Ambulatory Visit: Payer: Self-pay

## 2019-10-10 ENCOUNTER — Encounter: Payer: Self-pay | Admitting: Emergency Medicine

## 2019-10-10 DIAGNOSIS — M545 Low back pain, unspecified: Secondary | ICD-10-CM

## 2019-10-10 DIAGNOSIS — R300 Dysuria: Secondary | ICD-10-CM | POA: Insufficient documentation

## 2019-10-10 LAB — URINALYSIS, COMPLETE (UACMP) WITH MICROSCOPIC
Bacteria, UA: NONE SEEN
Bilirubin Urine: NEGATIVE
Glucose, UA: NEGATIVE mg/dL
Ketones, ur: NEGATIVE mg/dL
Leukocytes,Ua: NEGATIVE
Nitrite: NEGATIVE
Protein, ur: NEGATIVE mg/dL
Specific Gravity, Urine: 1.02 (ref 1.005–1.030)
WBC, UA: NONE SEEN WBC/hpf (ref 0–5)
pH: 8.5 — ABNORMAL HIGH (ref 5.0–8.0)

## 2019-10-10 MED ORDER — SULFAMETHOXAZOLE-TRIMETHOPRIM 800-160 MG PO TABS: 1.0000 | ORAL_TABLET | Freq: Two times a day (BID) | ORAL | 0 refills | Status: DC

## 2019-10-10 NOTE — Discharge Instructions (Signed)
Continue increased water and cranberry Tylenol as needed

## 2019-10-10 NOTE — ED Provider Notes (Signed)
[facility]    CSN: [id number] Arrival date & time: 10/10/19  1038      History   Chief Complaint Chief Complaint  Patient presents with  . Dysuria  . Back Pain    HPI Notie Lori Dougherty is a 69 y.o. female.   69 yo female with a c/o burning sensation with and without urination, bladder pressure sensation, low back pain and low grade fever since yesterday. Denies any abdominal pain, diarrhea, nausea/vomiting, chills, gross hematuria. 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    Dysuria Back Pain Associated symptoms: dysuria     Past Medical History:  Diagnosis Date  . GERD (gastroesophageal reflux disease)   . Hypertension   . Squamous cell skin cancer     Patient Active Problem List   Diagnosis Date Noted  . Hyperlipidemia 08/23/2018  . Hypertension 08/23/2018  . Psoriasis 08/23/2018  . Gross hematuria 08/23/2018  . Personal history of tobacco use 09/28/2017  . Pulmonary nodule, left 09/28/2017  . Chest pain 01/06/2016  . Family history of colon cancer 04/08/2015  . Gastroesophageal reflux disease 04/08/2015    Past Surgical History:  Procedure Laterality Date  . ABDOMINAL HYSTERECTOMY      OB History   No obstetric history on file.      Home Medications    Prior to Admission medications   Medication Sig Start Date End Date Taking? Authorizing Provider  Cholecalciferol (VITAMIN D3) 25 MCG (1000 UT) CAPS Take by mouth.   Yes [provider]  lisinopril-hydrochlorothiazide (PRINZIDE,ZESTORETIC) 20-12.5 MG tablet Take 1 tablet by mouth daily.   Yes [provider]  omeprazole (PRILOSEC) 20 MG capsule Take 20 mg by mouth 2 (two) times daily before a meal.   Yes [provider]  pravastatin (PRAVACHOL) 40 MG tablet Take 40 mg by mouth daily.   Yes [provider]  traZODone (DESYREL) 50 MG tablet Take by mouth. 04/19/18  Yes [provider]  acyclovir  (ZOVIRAX) 200 MG capsule Take by mouth. 01/27/17   [provider]  doxycycline (VIBRAMYCIN) 100 MG capsule Take 1 capsule (100 mg total) by mouth 2 (two) times daily. 08/25/18   Marylene Land, NP  estradiol (ESTRACE) 0.1 MG/GM vaginal cream Insert pea size amount vaginally nightly x 2 weeks, then every other night x 2 weeks, then twice weekly for maintenance 08/15/18   [provider]  raloxifene (EVISTA) 60 MG tablet TK 1 T PO ONCE D FOR OSTEOPOROSIS 04/19/18   [provider]  Saline GEL Apply topically. 09/06/16   [provider]  sulfamethoxazole-trimethoprim (BACTRIM DS) 800-160 MG tablet Take 1 tablet by mouth 2 (two) times daily. 10/10/19   Norval Gable, MD    Family History Family History  Problem Relation Age of Onset  . Breast cancer Neg Hx     Social History Social History   Tobacco Use  . Smoking status: Former Smoker    Quit date: 01/02/2007    Years since quitting: 12.7  . Smokeless tobacco: Never Used  Substance Use Topics  . Alcohol use: Yes    Comment: couple times weekly  . Drug use: No     Allergies   Patient has no known allergies.   Review of Systems Review of Systems  Genitourinary: Positive for dysuria.  Musculoskeletal: Positive for back pain.     Physical Exam Triage Vital Signs ED Triage Vitals  Enc Vitals Group     BP 10/10/19  1107 (!) 114/12     Pulse Rate 10/10/19 1107 97     Resp 10/10/19 1107 18     Temp 10/10/19 1105 100.1 F (37.8 C)     Temp Source 10/10/19 1105 Oral     SpO2 10/10/19 1107 100 %     Weight 10/10/19 1106 130 lb (59 kg)     Height 10/10/19 1106 5\' 5"  (1.651 m)     Head Circumference --      Peak Flow --      Pain Score 10/10/19 1106 6     Pain Loc --      Pain Edu? --      Excl. in [place]? --    No data found.  Updated Vital Signs BP (!) 114/12 (BP Location: Right Arm)   Pulse 97   Temp 100.1 F (37.8 C) (Oral)   Resp 18   Ht 5\' 5"  (1.651 m)   Wt 59 kg   SpO2 100%   BMI  21.63 kg/m   Visual Acuity Right Eye Distance:   Left Eye Distance:   Bilateral Distance:    Right Eye Near:   Left Eye Near:    Bilateral Near:     Physical Exam Vitals and nursing note reviewed.  Constitutional:      General: She is not in acute distress.    Appearance: She is not toxic-appearing or diaphoretic.  Abdominal:     General: There is no distension.     Palpations: Abdomen is soft.     Tenderness: There is no abdominal tenderness. There is no right CVA tenderness or left CVA tenderness.  Musculoskeletal:     Lumbar back: Tenderness (over the paraspinous muscles bilaterally) present. No swelling, edema, deformity, signs of trauma, lacerations or bony tenderness. Negative right straight leg raise test and negative left straight leg raise test.  Neurological:     Mental Status: She is alert.      UC Treatments / Results  Labs (all labs ordered are listed, but only abnormal results are displayed) Labs Reviewed  URINALYSIS, COMPLETE (UACMP) WITH MICROSCOPIC - Abnormal; Notable for the following components:      Result Value   pH 8.5 (*)    Hgb urine dipstick MODERATE (*)    All other components within normal limits  URINE CULTURE    EKG   Radiology No results found.  Procedures Procedures (including critical care time)  Medications Ordered in UC Medications - No data to display  Initial Impression / Assessment and Plan / UC Course  I have reviewed the triage vital signs and the nursing notes.  Pertinent labs & imaging results that were available during my care of the patient were reviewed by me and considered in my medical decision making (see chart for details).      Final Clinical Impressions(s) / UC Diagnoses   Final diagnoses:  Dysuria  Acute midline low back pain without sciatica     Discharge Instructions     Continue increased water and cranberry Tylenol as needed    ED Prescriptions    Medication Sig Dispense Auth. Provider     sulfamethoxazole-trimethoprim (BACTRIM DS) 800-160 MG tablet Take 1 tablet by mouth 2 (two) times daily. 10 tablet Norval Gable, MD      1. Lab results and diagnosis reviewed with patient 2. rx as per orders above; reviewed possible side effects, interactions, risks and benefits  3. Recommend supportive treatment with increased water intake, tylenol as needed 4.  Follow-up prn if symptoms worsen or don't improve   PDMP not reviewed this encounter.   Norval Gable, MD 10/10/19 [phone]

## 2019-10-10 NOTE — ED Triage Notes (Signed)
Patient c/o dysuria, back pain and pressure that started Monday.

## 2019-10-11 LAB — URINE CULTURE
Culture: NO GROWTH
Special Requests: NORMAL

## 2019-11-07 ENCOUNTER — Ambulatory Visit
Admission: EM | Admit: 2019-11-07 | Discharge: 2019-11-07 | Disposition: A | Discharge status: Home / Self Care | Payer: Medicare Other | Attending: Family Medicine | Admitting: Family Medicine

## 2019-11-07 ENCOUNTER — Other Ambulatory Visit: Payer: Self-pay

## 2019-11-07 ENCOUNTER — Ambulatory Visit: Payer: Medicare Other

## 2019-11-07 ENCOUNTER — Encounter: Payer: Self-pay | Admitting: Emergency Medicine

## 2019-11-07 DIAGNOSIS — Z87891 Personal history of nicotine dependence: Secondary | ICD-10-CM

## 2019-11-07 DIAGNOSIS — R05 Cough: Secondary | ICD-10-CM | POA: Insufficient documentation

## 2019-11-07 DIAGNOSIS — R0982 Postnasal drip: Secondary | ICD-10-CM | POA: Insufficient documentation

## 2019-11-07 DIAGNOSIS — R059 Cough, unspecified: Secondary | ICD-10-CM

## 2019-11-07 NOTE — Discharge Instructions (Signed)
Over the counter allergy medication (allegra, claritin or zyrtec) and steroid nose spray

## 2019-11-07 NOTE — ED Triage Notes (Signed)
Patient c/o chest congestion and cough x 1 week. She also reports generalized body aches. Patient refuses COVID testing. She is being tested for COVID tomorrow due to a procedure next Monday.

## 2019-11-07 NOTE — ED Provider Notes (Signed)
[facility]    CSN: [id number] Arrival date & time: 11/07/19  [id number]      History   Chief Complaint Chief Complaint  Patient presents with  . Cough    HPI Lori Dougherty is a 69 y.o. female.   70 yo female with a c/o cough, chest congestion and body aches for one week. Denies any fevers, chills, wheezing. No known sick contacts. States she's getting tested for covid tomorrow prior to procedure next week.    Cough   Past Medical History:  Diagnosis Date  . GERD (gastroesophageal reflux disease)   . Hypertension   . Squamous cell skin cancer     Patient Active Problem List   Diagnosis Date Noted  . Hyperlipidemia 08/23/2018  . Hypertension 08/23/2018  . Psoriasis 08/23/2018  . Gross hematuria 08/23/2018  . Personal history of tobacco use 09/28/2017  . Pulmonary nodule, left 09/28/2017  . Chest pain 01/06/2016  . Family history of colon cancer 04/08/2015  . Gastroesophageal reflux disease 04/08/2015    Past Surgical History:  Procedure Laterality Date  . ABDOMINAL HYSTERECTOMY      OB History   No obstetric history on file.      Home Medications    Prior to Admission medications   Medication Sig Start Date End Date Taking? Authorizing Provider  Cholecalciferol (VITAMIN D3) 25 MCG (1000 UT) CAPS Take by mouth.   Yes [provider]  lisinopril-hydrochlorothiazide (PRINZIDE,ZESTORETIC) 20-12.5 MG tablet Take 1 tablet by mouth daily.   Yes [provider]  omeprazole (PRILOSEC) 20 MG capsule Take 20 mg by mouth 2 (two) times daily before a meal.   Yes [provider]  pravastatin (PRAVACHOL) 40 MG tablet Take 40 mg by mouth daily.   Yes [provider]  traZODone (DESYREL) 50 MG tablet Take by mouth. 04/19/18  Yes [provider]  acyclovir (ZOVIRAX) 200 MG capsule Take by mouth. 01/27/17   [provider]  doxycycline (VIBRAMYCIN) 100 MG capsule Take 1 capsule (100 mg total) by mouth 2  (two) times daily. 08/25/18   Marylene Land, NP  estradiol (ESTRACE) 0.1 MG/GM vaginal cream Insert pea size amount vaginally nightly x 2 weeks, then every other night x 2 weeks, then twice weekly for maintenance 08/15/18   [provider]  raloxifene (EVISTA) 60 MG tablet TK 1 T PO ONCE D FOR OSTEOPOROSIS 04/19/18   [provider]  Saline GEL Apply topically. 09/06/16   [provider]  sulfamethoxazole-trimethoprim (BACTRIM DS) 800-160 MG tablet Take 1 tablet by mouth 2 (two) times daily. 10/10/19   Norval Gable, MD    Family History Family History  Problem Relation Age of Onset  . Breast cancer Neg Hx     Social History Social History   Tobacco Use  . Smoking status: Former Smoker    Quit date: 01/02/2007    Years since quitting: 12.8  . Smokeless tobacco: Never Used  Substance Use Topics  . Alcohol use: Yes    Comment: couple times weekly  . Drug use: No     Allergies   Patient has no known allergies.   Review of Systems Review of Systems  Respiratory: Positive for cough.      Physical Exam Triage Vital Signs ED Triage Vitals  Enc Vitals Group     BP 11/07/19 1014 135/69     Pulse Rate 11/07/19 1014 66     Resp 11/07/19 1014 18     Temp 11/07/19 1014  98.2 F (36.8 C)     Temp Source 11/07/19 1014 Oral     SpO2 11/07/19 1014 99 %     Weight 11/07/19 1011 130 lb (59 kg)     Height 11/07/19 1011 5\' 5"  (1.651 m)     Head Circumference --      Peak Flow --      Pain Score 11/07/19 1011 0     Pain Loc --      Pain Edu? --      Excl. in [place]? --    No data found.  Updated Vital Signs BP 135/69 (BP Location: Right Arm)   Pulse 66   Temp 98.2 F (36.8 C) (Oral)   Resp 18   Ht 5\' 5"  (1.651 m)   Wt 59 kg   SpO2 99%   BMI 21.63 kg/m   Visual Acuity Right Eye Distance:   Left Eye Distance:   Bilateral Distance:    Right Eye Near:   Left Eye Near:    Bilateral Near:     Physical Exam Vitals and nursing note reviewed.    Constitutional:      General: She is not in acute distress.    Appearance: She is not toxic-appearing or diaphoretic.  HENT:     Right Ear: Tympanic membrane normal.     Left Ear: Tympanic membrane normal.     Mouth/Throat:     Comments: Post nasal drainage Cardiovascular:     Rate and Rhythm: Normal rate.     Heart sounds: Normal heart sounds.  Pulmonary:     Effort: Pulmonary effort is normal. No respiratory distress.     Breath sounds: Normal breath sounds. No stridor. No wheezing, rhonchi or rales.  Neurological:     Mental Status: She is alert.      UC Treatments / Results  Labs (all labs ordered are listed, but only abnormal results are displayed) Labs Reviewed - No data to display  EKG   Radiology DG Chest 2 View  Result Date: 11/07/2019 CLINICAL DATA:  Cough and chest congestion for 1 week. EXAM: CHEST - 2 VIEW COMPARISON:  None. FINDINGS: The heart size and mediastinal contours are within normal limits. Both lungs are clear. The visualized skeletal structures are unremarkable. IMPRESSION: No active cardiopulmonary disease. Electronically Signed   By: Kerby Moors M.D.   On: 11/07/2019 10:56    Procedures Procedures (including critical care time)  Medications Ordered in UC Medications - No data to display  Initial Impression / Assessment and Plan / UC Course  I have reviewed the triage vital signs and the nursing notes.  Pertinent labs & imaging results that were available during my care of the patient were reviewed by me and considered in my medical decision making (see chart for details).      Final Clinical Impressions(s) / UC Diagnoses   Final diagnoses:  Cough  Post-nasal drainage     Discharge Instructions     Over the counter allergy medication (allegra, claritin or zyrtec) and steroid nose spray    ED Prescriptions    None      1. Chest x-ray results and diagnosis reviewed with patient 2. Recommend supportive treatment otc  medications, rest, fluids 3. Patient refused covid testing;states getting tested tomorrow pre--procedure 4. Follow-up prn if symptoms worsen or don't improve   PDMP not reviewed this encounter.   Norval Gable, MD 11/07/19 [phone]

## 2019-12-21 IMAGING — CT CT ABD-PEL WO/W CM
3 of 12 series · 12 of 46 positions shown, 18 images · IV contrast (iopamidol)
Comparison: None.

CLINICAL DATA: Gross hematuria on 08/19/2018

EXAM:
CT ABDOMEN AND PELVIS WITHOUT AND WITH CONTRAST
TECHNIQUE: Multidetector CT imaging of the abdomen and pelvis was performed
following the standard protocol before and following the bolus
administration of intravenous contrast.
CONTRAST:  100mL [id number] IOPAMIDOL ([id number]) INJECTION 61%

[Series 2: without pre · axial · non-contrast · 0.63mm/px · z∈[-1505,-1400]mm · 3 of 84 slices shown]
[im 11/84  soft-tissue]
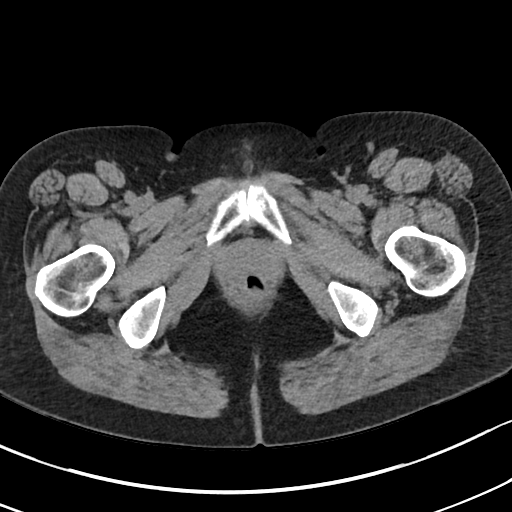
[im 21/84  soft-tissue]
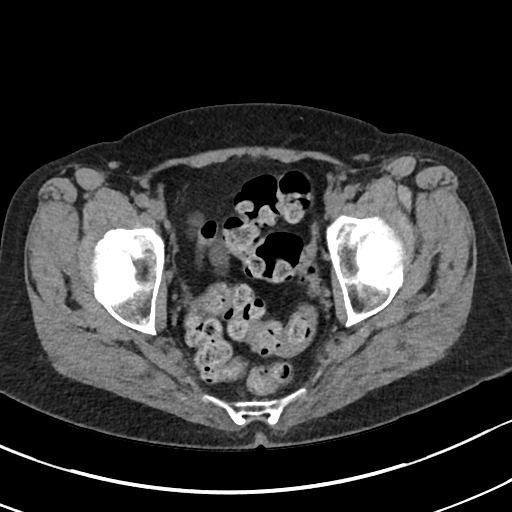
[im 32/84  soft-tissue]
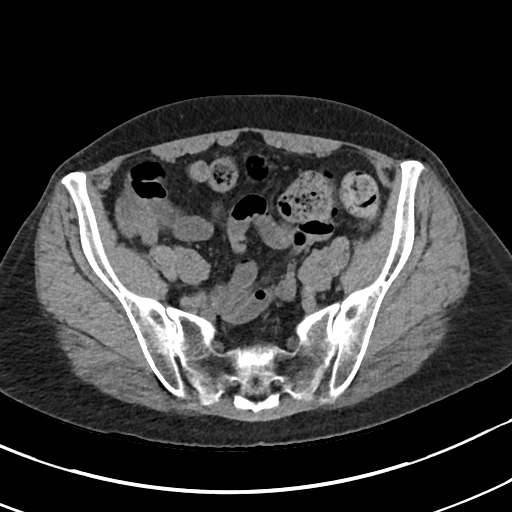

[Series 5: cor without without pre · coronal · non-contrast · 0.63mm/px · 2 of 127 slices shown, 3 images]
[im 43/127  soft-tissue]
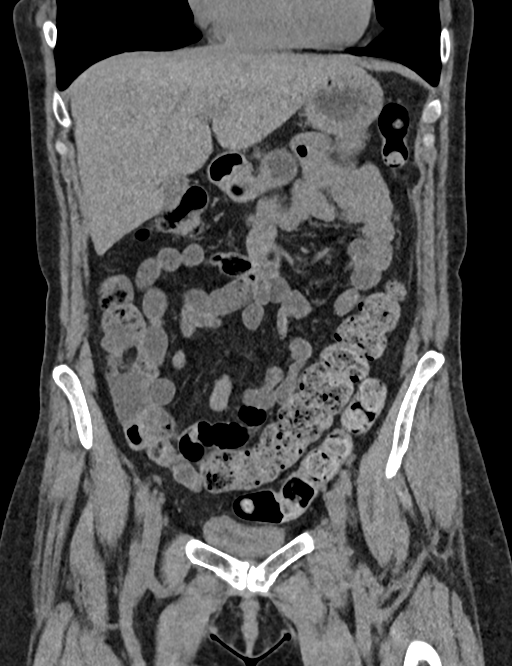
[im 43/127  bone]
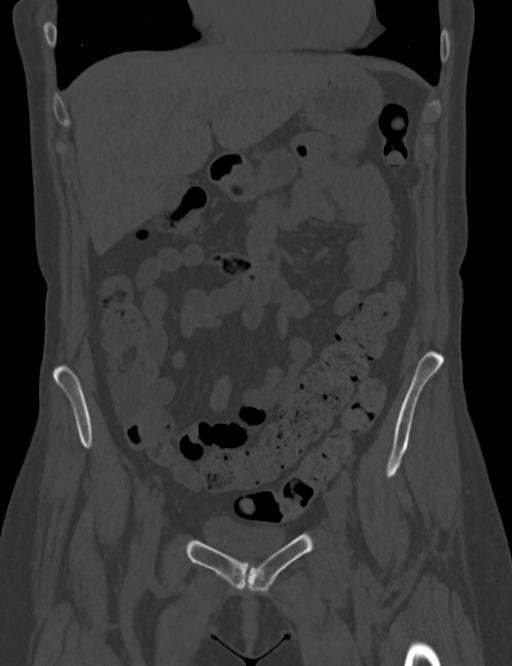
[im 85/127  soft-tissue]
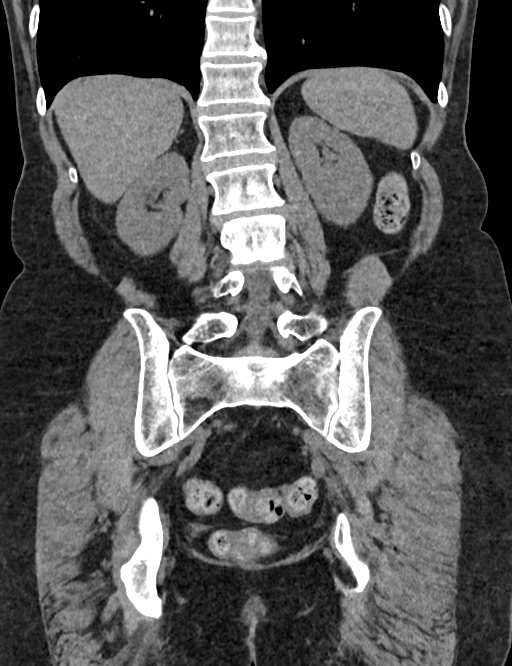

[Series 17: axial delay delay prone · axial · delayed · 0.66mm/px · z∈[-1456,-1116]mm · 7 of 92 slices shown, 12 images]
[im 12/92  soft-tissue]
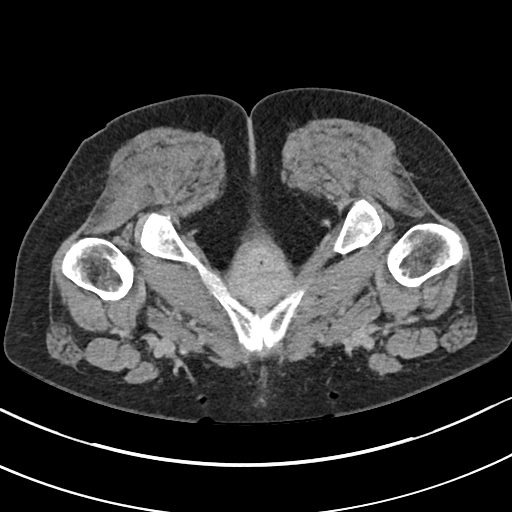
[im 12/92  bone]
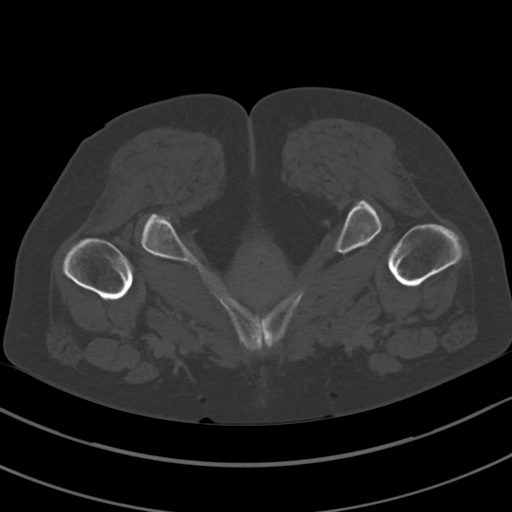
[im 23/92  soft-tissue]
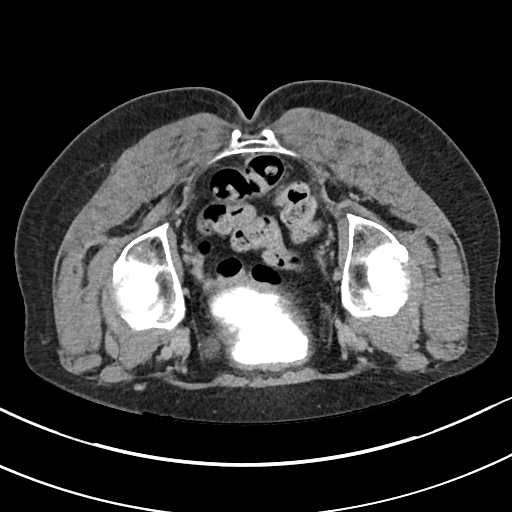
[im 35/92  soft-tissue]
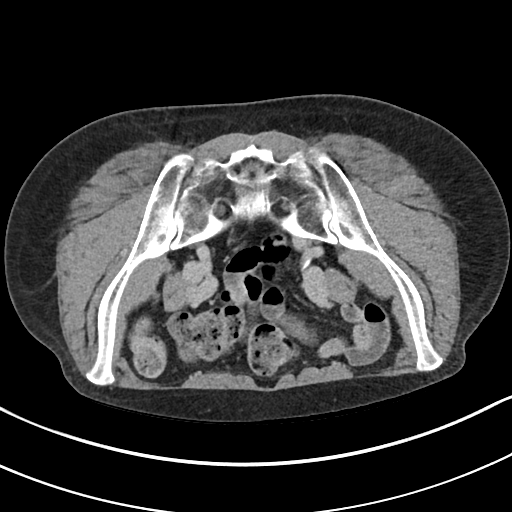
[im 46/92  soft-tissue]
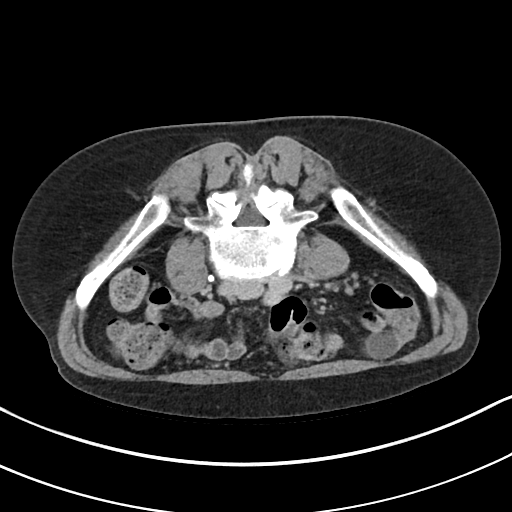
[im 46/92  lung]
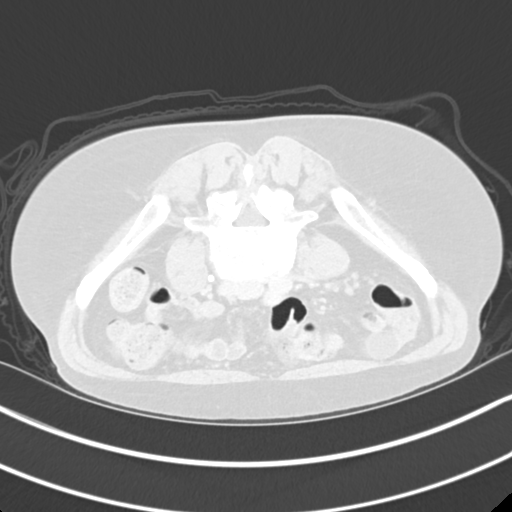
[im 57/92  soft-tissue]
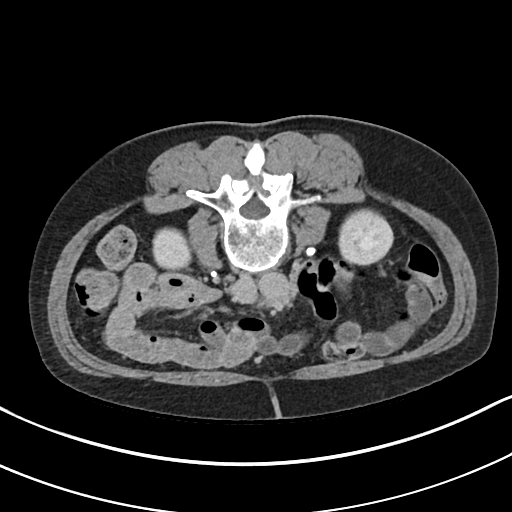
[im 57/92  lung]
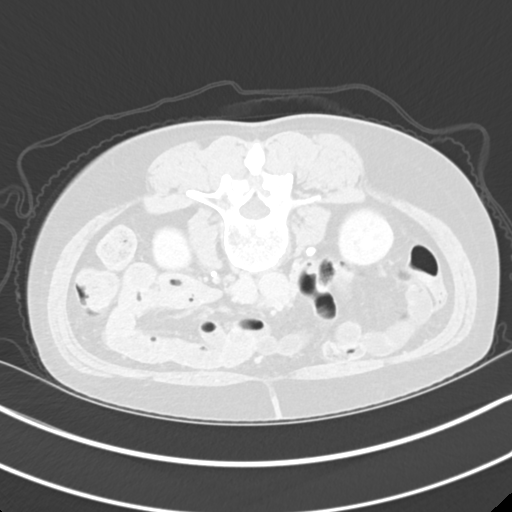
[im 69/92  soft-tissue]
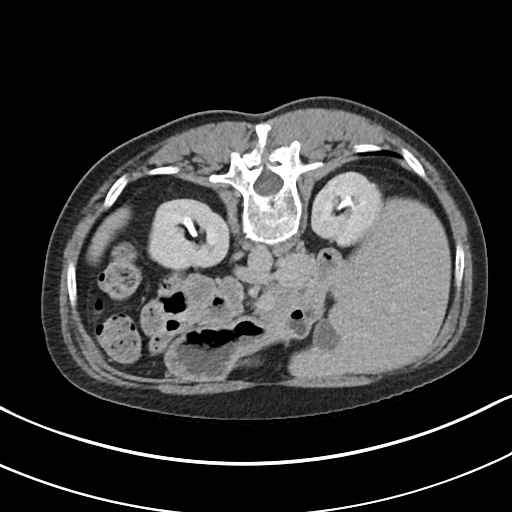
[im 69/92  lung]
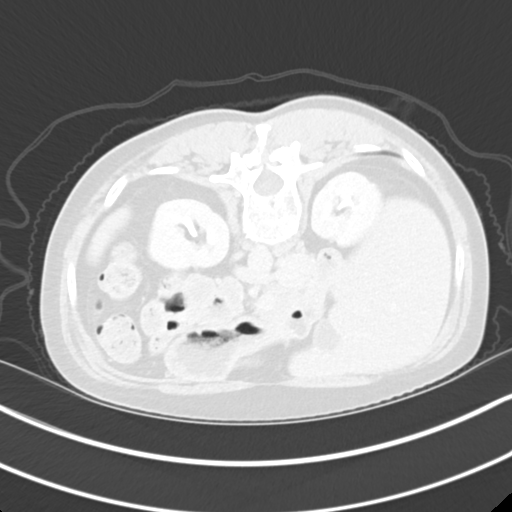
[im 80/92  soft-tissue]
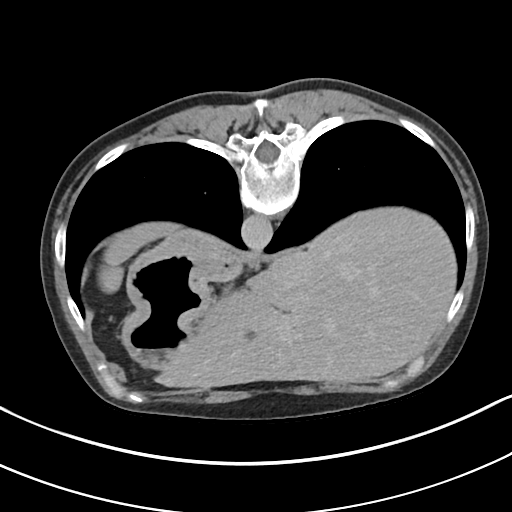
[im 80/92  lung]
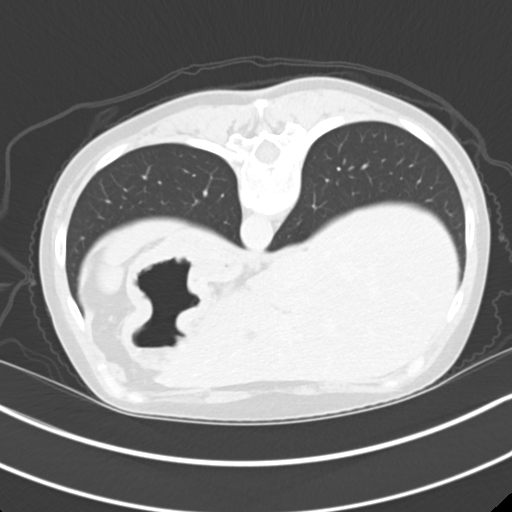

[12 of 46 positions shown; findings below may reference images not displayed]

FINDINGS: Lower chest: 5 mm round pulmonary nodule in the left lower lobe on
image [DATE], partially included on today's exam. Separate calcified 2
mm nodule in the right lower lobe on image [DATE].

Hepatobiliary: 4 small hepatic cysts are present. On image [DATE]
there is peripheral nodular enhancement in a 0.9 cm originally
hypodense lesion in segment 7, which demonstrates diffuse delayed
enhancement compatible with a small hemangioma. Gallbladder
unremarkable. No biliary dilatation.

Pancreas: Unremarkable

Spleen: Unremarkable

Adrenals/Urinary Tract: Both adrenal glands appear normal. 1.6 cm
benign cyst of the left kidney upper pole.

No urinary tract calculi, abnormal renal parenchymal enhancement, or
abnormal urographic phase filling defect along the urothelium to
explain the patient's hematuria.

Stomach/Bowel: Prominent stool throughout the colon favors
constipation.

Vascular/Lymphatic: Aortoiliac atherosclerotic vascular disease.

Reproductive: Uterus absent.  Adnexa unremarkable.

Other: No supplemental non-categorized findings.

Musculoskeletal: Old healed deformity of the left inferior pubic
ramus. Mild lumbar spondylosis and degenerative disc disease.
IMPRESSION: 1. No cause for hematuria is identified.
2.  Prominent stool throughout the colon favors constipation.
3.  Aortic Atherosclerosis ([id number]).
4. 5 mm left lower lobe pulmonary nodule. No follow-up needed if
patient is low-risk. Non-contrast chest CT can be considered in 12
months if patient is high-risk. This recommendation follows the
consensus statement: Guidelines for Management of Incidental
Pulmonary Nodules Detected on CT Images: From the [HOSPITAL]
5. Several small hepatic cysts. Suspected hemangioma posteriorly
along the dome of the right hepatic lobe.

## 2020-03-10 ENCOUNTER — Other Ambulatory Visit: Payer: Self-pay | Admitting: Medical Oncology

## 2020-03-10 DIAGNOSIS — Z1231 Encounter for screening mammogram for malignant neoplasm of breast: Secondary | ICD-10-CM

## 2020-05-21 ENCOUNTER — Ambulatory Visit: Payer: Medicare Other

## 2020-05-28 ENCOUNTER — Ambulatory Visit
Admission: RE | Admit: 2020-05-28 | Discharge: 2020-05-28 | Disposition: A | Discharge status: Home / Self Care | Payer: Medicare Other | Source: Ambulatory Visit | Attending: Medical Oncology | Admitting: Medical Oncology

## 2020-05-28 ENCOUNTER — Other Ambulatory Visit: Payer: Self-pay

## 2020-05-28 DIAGNOSIS — Z1231 Encounter for screening mammogram for malignant neoplasm of breast: Secondary | ICD-10-CM | POA: Insufficient documentation

## 2020-06-02 DIAGNOSIS — M810 Age-related osteoporosis without current pathological fracture: Secondary | ICD-10-CM | POA: Insufficient documentation

## 2020-09-05 ENCOUNTER — Other Ambulatory Visit: Payer: Self-pay | Admitting: Physician Assistant

## 2020-09-05 DIAGNOSIS — Z780 Asymptomatic menopausal state: Secondary | ICD-10-CM

## 2020-09-05 DIAGNOSIS — Z13820 Encounter for screening for osteoporosis: Secondary | ICD-10-CM

## 2020-10-07 DIAGNOSIS — Z961 Presence of intraocular lens: Secondary | ICD-10-CM | POA: Insufficient documentation

## 2020-10-09 ENCOUNTER — Other Ambulatory Visit: Payer: Self-pay

## 2020-10-09 ENCOUNTER — Ambulatory Visit (INDEPENDENT_AMBULATORY_CARE_PROVIDER_SITE_OTHER): Payer: Medicare Other

## 2020-10-09 ENCOUNTER — Ambulatory Visit
Admission: RE | Admit: 2020-10-09 | Discharge: 2020-10-09 | Disposition: A | Discharge status: Home / Self Care | Payer: Medicare Other | Source: Ambulatory Visit | Attending: Physician Assistant | Admitting: Physician Assistant

## 2020-10-09 VITALS — BP 122/54 | HR 67 | Temp 98.0°F | Resp 18 | Ht 65.0 in | Wt 130.1 lb

## 2020-10-09 DIAGNOSIS — M5136 Other intervertebral disc degeneration, lumbar region: Secondary | ICD-10-CM

## 2020-10-09 DIAGNOSIS — M545 Low back pain, unspecified: Secondary | ICD-10-CM | POA: Diagnosis not present

## 2020-10-09 MED ORDER — TIZANIDINE HCL 4 MG PO TABS: 4.0000 mg | ORAL_TABLET | Freq: Every evening | ORAL | 1 refills | Status: AC

## 2020-10-09 NOTE — Discharge Instructions (Addendum)
BACK PAIN: Stressed avoiding painful activities . RICE (REST, ICE, COMPRESSION, ELEVATION) guidelines reviewed. May alternate ice and heat. Consider use of muscle rubs, Salonpas patches, etc. Use medications as directed including muscle relaxers if prescribed. Take anti-inflammatory medications as prescribed or OTC NSAIDs/Tylenol.  F/u with PCP in 7-10 days for reexamination, and please feel free to call or return to the urgent care at any time for any questions or concerns you may have and we will be happy to help you!   BACK PAIN RED FLAGS: If the back pain acutely worsens or there are any red flag symptoms such as numbness/tingling, leg weakness, saddle anesthesia, or loss of bowel/bladder control, go immediately to the ER. Follow up with [place] as scheduled or sooner if the pain does not begin to resolve or if it worsens before the follow up    You have a condition requiring you to follow up with Orthopedics so please call one of the following office for appointment:   [facility] [address] [place], [place] [address] Phone: [phone]  [facility] [address] [place], [place] [address] Phone: [phone]

## 2020-10-09 NOTE — ED Triage Notes (Signed)
Patient c/o right side back pain that started 1 month ago. She states she injured it by bending over and picking up baseballs.

## 2020-10-09 NOTE — ED Provider Notes (Signed)
[facility]    CSN: [id number] Arrival date & time: 10/09/20  1145      History   Chief Complaint Chief Complaint  Patient presents with  . Back Pain    HPI Lori Dougherty is a 70 y.o. female presenting for approximately 1 month history of right-sided lower back pain with occasional radiation to the right anterior thigh.  Denies any radiation of pain further down the leg.  No associated numbness, weakness or tingling.  Patient says that a month ago she was bending over multiple times throughout the day to pick up baseboards off the ground and had pain onset the next day.  Patient denies any history of back problems.  She says she has been taking ibuprofen 40 mg tablets about 1 time a day as needed for pain.  Pain is worse with sitting and improves with standing.  Denies any pain with walking or ascending or descending stairs.  No urinary symptoms.  Patient has appointment with her PCP in 1 month but wanted to get her back checked out before then.  She has no other concerns.  HPI  Past Medical History:  Diagnosis Date  . GERD (gastroesophageal reflux disease)   . Hypertension   . Squamous cell skin cancer     Patient Active Problem List   Diagnosis Date Noted  . Hyperlipidemia 08/23/2018  . Hypertension 08/23/2018  . Psoriasis 08/23/2018  . Gross hematuria 08/23/2018  . Personal history of tobacco use 09/28/2017  . Pulmonary nodule, left 09/28/2017  . Chest pain 01/06/2016  . Family history of colon cancer 04/08/2015  . Gastroesophageal reflux disease 04/08/2015    Past Surgical History:  Procedure Laterality Date  . ABDOMINAL HYSTERECTOMY      OB History   No obstetric history on file.      Home Medications    Prior to Admission medications   Medication Sig Start Date End Date Taking? Authorizing Provider  acyclovir (ZOVIRAX) 200 MG capsule Take by mouth. 01/27/17  Yes [provider]  Cholecalciferol (VITAMIN D3) 25 MCG (1000 UT)  CAPS Take by mouth.   Yes [provider]  estradiol (ESTRACE) 0.1 MG/GM vaginal cream Insert pea size amount vaginally nightly x 2 weeks, then every other night x 2 weeks, then twice weekly for maintenance 08/15/18  Yes [provider]  lisinopril-hydrochlorothiazide (PRINZIDE,ZESTORETIC) 20-12.5 MG tablet Take 1 tablet by mouth daily.   Yes [provider]  omeprazole (PRILOSEC) 20 MG capsule Take 20 mg by mouth 2 (two) times daily before a meal.   Yes [provider]  pravastatin (PRAVACHOL) 40 MG tablet Take 40 mg by mouth daily.   Yes [provider]  raloxifene (EVISTA) 60 MG tablet TK 1 T PO ONCE D FOR OSTEOPOROSIS 04/19/18  Yes [provider]  tiZANidine (ZANAFLEX) 4 MG tablet Take 1 tablet (4 mg total) by mouth at bedtime for 10 days. 10/09/20 10/19/20 Yes Danton Clap, PA-C  traZODone (DESYREL) 50 MG tablet Take by mouth. 04/19/18  Yes [provider]  doxycycline (VIBRAMYCIN) 100 MG capsule Take 1 capsule (100 mg total) by mouth 2 (two) times daily. 08/25/18   Marylene Land, NP  Saline GEL Apply topically. 09/06/16   [provider]  sulfamethoxazole-trimethoprim (BACTRIM DS) 800-160 MG tablet Take 1 tablet by mouth 2 (two) times daily. 10/10/19   Norval Gable, MD    Family History Family History  Problem Relation Age of Onset  . Breast cancer Neg Hx  Social History Social History   Tobacco Use  . Smoking status: Former Smoker    Quit date: 01/02/2007    Years since quitting: 13.7  . Smokeless tobacco: Never Used  Vaping Use  . Vaping Use: Never used  Substance Use Topics  . Alcohol use: Yes    Comment: couple times weekly  . Drug use: No     Allergies   Patient has no known allergies.   Review of Systems Review of Systems  Constitutional: Negative for fatigue and fever.  Gastrointestinal: Negative for abdominal pain, nausea and vomiting.  Genitourinary: Negative for dysuria.   Musculoskeletal: Positive for back pain. Negative for gait problem.  Skin: Negative for rash.  Neurological: Negative for weakness and numbness.     Physical Exam Triage Vital Signs ED Triage Vitals  Enc Vitals Group     BP 10/09/20 1244 (!) 122/54     Pulse Rate 10/09/20 1244 67     Resp 10/09/20 1244 18     Temp 10/09/20 1244 98 F (36.7 C)     Temp Source 10/09/20 1244 Oral     SpO2 10/09/20 1244 100 %     Weight 10/09/20 1241 130 lb 1.1 oz (59 kg)     Height 10/09/20 1241 5\' 5"  (1.651 m)     Head Circumference --      Peak Flow --      Pain Score 10/09/20 1241 5     Pain Loc --      Pain Edu? --      Excl. in [place]? --    No data found.  Updated Vital Signs BP (!) 122/54 (BP Location: Left Arm)   Pulse 67   Temp 98 F (36.7 C) (Oral)   Resp 18   Ht 5\' 5"  (1.651 m)   Wt 130 lb 1.1 oz (59 kg)   SpO2 100%   BMI 21.64 kg/m      Physical Exam Vitals and nursing note reviewed.  Constitutional:      General: She is not in acute distress.    Appearance: Normal appearance. She is not ill-appearing or toxic-appearing.  HENT:     Head: Normocephalic and atraumatic.  Eyes:     General: No scleral icterus.       Right eye: No discharge.        Left eye: No discharge.     Conjunctiva/sclera: Conjunctivae normal.  Cardiovascular:     Rate and Rhythm: Normal rate and regular rhythm.     Heart sounds: Normal heart sounds.  Pulmonary:     Effort: Pulmonary effort is normal. No respiratory distress.     Breath sounds: Normal breath sounds.  Musculoskeletal:     Cervical back: Neck supple.     Lumbar back: Tenderness (mild TTP right lumbar region) present. No bony tenderness. Normal range of motion. Negative right straight leg raise test and negative left straight leg raise test.  Skin:    General: Skin is dry.  Neurological:     General: No focal deficit present.     Mental Status: She is alert. Mental status is at baseline.     Motor: No weakness.     Gait: Gait  normal.  Psychiatric:        Mood and Affect: Mood normal.        Behavior: Behavior normal.        Thought Content: Thought content normal.      UC Treatments / Results  Labs (  all labs ordered are listed, but only abnormal results are displayed) Labs Reviewed - No data to display  EKG   Radiology DG Lumbar Spine Complete  Result Date: 10/09/2020 CLINICAL DATA:  Back pain EXAM: LUMBAR SPINE - COMPLETE 4+ VIEW COMPARISON:  None. FINDINGS: Normal lumbar lordosis. No acute fracture or listhesis of the lumbar spine. Vertebral body height has been preserved. There is intervertebral disc space narrowing and endplate remodeling at D9-I3 in keeping with changes of moderate degenerative disc disease, most severe at L3-4. Oblique views demonstrate no evidence of pars defect. The paraspinal soft tissues are unremarkable. IMPRESSION: Moderate degenerative disc disease as outlined above. Electronically Signed   By: Fidela Salisbury MD   On: 10/09/2020 13:50    Procedures Procedures (including critical care time)  Medications Ordered in UC Medications - No data to display  Initial Impression / Assessment and Plan / UC Course  I have reviewed the triage vital signs and the nursing notes.  Pertinent labs & imaging results that were available during my care of the patient were reviewed by me and considered in my medical decision making (see chart for details).   70 year old female presenting for back pain x1 month.  No red flag signs or symptoms.  Due to duration of her back pain, imaging of the L-spine obtained today which does show degenerative disc disease worse at L3-L4.  A disc herniation or bulge in this region would be consistent with the areas she is experiencing some radiation of pain to of the anterior thigh.  I reviewed x-ray results independently and then discussed them with patient.  Advise follow-up with PCP at this time as she would likely benefit from a MRI given duration of symptoms  without improvement.  Advised to continue treating with ibuprofen and Tylenol as needed for pain.  I did send in a prescription for tizanidine for her to take especially at night for muscle spasms and cramps.  Supportive care advised.  ED flag signs and symptoms for back pain reviewed.  Final Clinical Impressions(s) / UC Diagnoses   Final diagnoses:  Acute right-sided low back pain, unspecified whether sciatica present  Degenerative lumbar disc     Discharge Instructions     BACK PAIN: Stressed avoiding painful activities . RICE (REST, ICE, COMPRESSION, ELEVATION) guidelines reviewed. May alternate ice and heat. Consider use of muscle rubs, Salonpas patches, etc. Use medications as directed including muscle relaxers if prescribed. Take anti-inflammatory medications as prescribed or OTC NSAIDs/Tylenol.  F/u with PCP in 7-10 days for reexamination, and please feel free to call or return to the urgent care at any time for any questions or concerns you may have and we will be happy to help you!   BACK PAIN RED FLAGS: If the back pain acutely worsens or there are any red flag symptoms such as numbness/tingling, leg weakness, saddle anesthesia, or loss of bowel/bladder control, go immediately to the ER. Follow up with [place] as scheduled or sooner if the pain does not begin to resolve or if it worsens before the follow up    You have a condition requiring you to follow up with Orthopedics so please call one of the following office for appointment:   [facility] [address] [place], [place] [address] Phone: [phone]  [facility] [address] [place],  [address] Phone: [phone]     ED Prescriptions    Medication Sig Dispense Auth. Provider   tiZANidine (ZANAFLEX) 4 MG tablet Take 1  tablet (4 mg total) by mouth at bedtime for 10 days. 10 tablet Gretta Cool     PDMP not reviewed this encounter.   Danton Clap, PA-C 10/09/20 1702

## 2020-10-21 DIAGNOSIS — Z961 Presence of intraocular lens: Secondary | ICD-10-CM | POA: Insufficient documentation

## 2021-04-17 ENCOUNTER — Other Ambulatory Visit: Payer: Self-pay | Admitting: Family Medicine

## 2021-04-17 DIAGNOSIS — R109 Unspecified abdominal pain: Secondary | ICD-10-CM

## 2021-04-17 DIAGNOSIS — R140 Abdominal distension (gaseous): Secondary | ICD-10-CM

## 2021-04-17 DIAGNOSIS — M549 Dorsalgia, unspecified: Secondary | ICD-10-CM

## 2021-04-23 ENCOUNTER — Ambulatory Visit
Admission: RE | Admit: 2021-04-23 | Discharge: 2021-04-23 | Disposition: A | Discharge status: Home / Self Care | Payer: Medicare Other | Source: Ambulatory Visit | Attending: Family Medicine | Admitting: Family Medicine

## 2021-04-23 ENCOUNTER — Other Ambulatory Visit: Payer: Self-pay

## 2021-04-23 ENCOUNTER — Ambulatory Visit (HOSPITAL_COMMUNITY): Payer: Medicare Other

## 2021-04-23 DIAGNOSIS — M549 Dorsalgia, unspecified: Secondary | ICD-10-CM | POA: Diagnosis present

## 2021-04-23 DIAGNOSIS — R140 Abdominal distension (gaseous): Secondary | ICD-10-CM | POA: Diagnosis not present

## 2021-04-23 DIAGNOSIS — R109 Unspecified abdominal pain: Secondary | ICD-10-CM | POA: Diagnosis present

## 2021-04-30 ENCOUNTER — Other Ambulatory Visit: Payer: Self-pay | Admitting: Family Medicine

## 2021-04-30 DIAGNOSIS — M549 Dorsalgia, unspecified: Secondary | ICD-10-CM

## 2021-04-30 DIAGNOSIS — R1084 Generalized abdominal pain: Secondary | ICD-10-CM

## 2021-04-30 DIAGNOSIS — R140 Abdominal distension (gaseous): Secondary | ICD-10-CM

## 2021-05-12 ENCOUNTER — Other Ambulatory Visit (HOSPITAL_COMMUNITY): Payer: Medicare Other

## 2021-05-18 ENCOUNTER — Ambulatory Visit
Admission: RE | Admit: 2021-05-18 | Discharge: 2021-05-18 | Disposition: A | Discharge status: Home / Self Care | Payer: Medicare Other | Source: Ambulatory Visit | Attending: Family Medicine | Admitting: Family Medicine

## 2021-05-18 ENCOUNTER — Other Ambulatory Visit: Payer: Self-pay

## 2021-05-18 DIAGNOSIS — M549 Dorsalgia, unspecified: Secondary | ICD-10-CM | POA: Diagnosis present

## 2021-05-18 DIAGNOSIS — R140 Abdominal distension (gaseous): Secondary | ICD-10-CM | POA: Diagnosis present

## 2021-05-18 DIAGNOSIS — R1084 Generalized abdominal pain: Secondary | ICD-10-CM | POA: Diagnosis present

## 2021-05-18 MED ORDER — IOHEXOL 300 MG/ML  SOLN
80.0000 mL | Freq: Once | INTRAMUSCULAR | Status: AC | PRN
  Administered 2021-05-18: 80 mL via INTRAVENOUS

## 2021-06-15 ENCOUNTER — Other Ambulatory Visit: Payer: Self-pay | Admitting: Family Medicine

## 2021-06-15 DIAGNOSIS — Z1231 Encounter for screening mammogram for malignant neoplasm of breast: Secondary | ICD-10-CM

## 2021-08-11 ENCOUNTER — Ambulatory Visit: Payer: Medicare Other

## 2021-08-11 ENCOUNTER — Ambulatory Visit
Admission: RE | Admit: 2021-08-11 | Discharge: 2021-08-11 | Disposition: A | Discharge status: Home / Self Care | Payer: Medicare Other | Source: Ambulatory Visit | Attending: Family Medicine | Admitting: Family Medicine

## 2021-08-11 ENCOUNTER — Ambulatory Visit
Admission: RE | Admit: 2021-08-11 | Discharge: 2021-08-11 | Disposition: A | Discharge status: Home / Self Care | Payer: Medicare Other | Attending: Family Medicine | Admitting: Family Medicine

## 2021-08-11 ENCOUNTER — Other Ambulatory Visit: Payer: Self-pay | Admitting: Family Medicine

## 2021-08-11 ENCOUNTER — Other Ambulatory Visit: Payer: Self-pay

## 2021-08-11 DIAGNOSIS — Z8616 Personal history of COVID-19: Secondary | ICD-10-CM

## 2021-08-11 DIAGNOSIS — R61 Generalized hyperhidrosis: Secondary | ICD-10-CM

## 2021-08-11 DIAGNOSIS — R058 Other specified cough: Secondary | ICD-10-CM | POA: Insufficient documentation

## 2021-08-11 DIAGNOSIS — R6883 Chills (without fever): Secondary | ICD-10-CM | POA: Insufficient documentation

## 2021-09-10 ENCOUNTER — Ambulatory Visit (INDEPENDENT_AMBULATORY_CARE_PROVIDER_SITE_OTHER): Payer: Medicare Other

## 2021-09-10 ENCOUNTER — Ambulatory Visit (INDEPENDENT_AMBULATORY_CARE_PROVIDER_SITE_OTHER)
Admission: EM | Admit: 2021-09-10 | Discharge: 2021-09-10 | Disposition: A | Discharge status: Home / Self Care | Payer: Medicare Other | Source: Home / Self Care

## 2021-09-10 ENCOUNTER — Other Ambulatory Visit: Payer: Self-pay

## 2021-09-10 ENCOUNTER — Emergency Department
Admission: EM | Admit: 2021-09-10 | Discharge: 2021-09-10 | Disposition: A | Discharge status: Home / Self Care | Payer: Medicare Other | Attending: Emergency Medicine | Admitting: Emergency Medicine

## 2021-09-10 ENCOUNTER — Emergency Department: Payer: Medicare Other

## 2021-09-10 DIAGNOSIS — R052 Subacute cough: Secondary | ICD-10-CM | POA: Insufficient documentation

## 2021-09-10 DIAGNOSIS — R071 Chest pain on breathing: Secondary | ICD-10-CM | POA: Diagnosis not present

## 2021-09-10 DIAGNOSIS — J180 Bronchopneumonia, unspecified organism: Secondary | ICD-10-CM | POA: Insufficient documentation

## 2021-09-10 DIAGNOSIS — Z8616 Personal history of COVID-19: Secondary | ICD-10-CM | POA: Diagnosis not present

## 2021-09-10 DIAGNOSIS — R079 Chest pain, unspecified: Secondary | ICD-10-CM

## 2021-09-10 DIAGNOSIS — R1012 Left upper quadrant pain: Secondary | ICD-10-CM | POA: Diagnosis not present

## 2021-09-10 DIAGNOSIS — R0781 Pleurodynia: Secondary | ICD-10-CM | POA: Diagnosis present

## 2021-09-10 LAB — CBC WITH DIFFERENTIAL/PLATELET
Abs Immature Granulocytes: 0.06 10*3/uL (ref 0.00–0.07)
Basophils Absolute: 0 10*3/uL (ref 0.0–0.1)
Basophils Relative: 0 %
Eosinophils Absolute: 0.2 10*3/uL (ref 0.0–0.5)
Eosinophils Relative: 1 %
HCT: 36.6 % (ref 36.0–46.0)
Hemoglobin: 12.3 g/dL (ref 12.0–15.0)
Immature Granulocytes: 0 %
Lymphocytes Relative: 9 %
Lymphs Abs: 1.2 10*3/uL (ref 0.7–4.0)
MCH: 30.6 pg (ref 26.0–34.0)
MCHC: 33.6 g/dL (ref 30.0–36.0)
MCV: 91 fL (ref 80.0–100.0)
Monocytes Absolute: 0.8 10*3/uL (ref 0.1–1.0)
Monocytes Relative: 6 %
Neutro Abs: 11.4 10*3/uL — ABNORMAL HIGH (ref 1.7–7.7)
Neutrophils Relative %: 84 %
Platelets: 271 10*3/uL (ref 150–400)
RBC: 4.02 MIL/uL (ref 3.87–5.11)
RDW: 12.1 % (ref 11.5–15.5)
WBC: 13.6 10*3/uL — ABNORMAL HIGH (ref 4.0–10.5)
nRBC: 0 % (ref 0.0–0.2)

## 2021-09-10 LAB — COMPREHENSIVE METABOLIC PANEL
ALT: 24 U/L (ref 0–44)
AST: 25 U/L (ref 15–41)
Albumin: 4.5 g/dL (ref 3.5–5.0)
Alkaline Phosphatase: 82 U/L (ref 38–126)
Anion gap: 9 (ref 5–15)
BUN: 13 mg/dL (ref 8–23)
CO2: 24 mmol/L (ref 22–32)
Calcium: 8.9 mg/dL (ref 8.9–10.3)
Chloride: 98 mmol/L (ref 98–111)
Creatinine, Ser: 0.77 mg/dL (ref 0.44–1.00)
GFR, Estimated: >60 mL/min (ref >60)
Glucose, Bld: 149 mg/dL — ABNORMAL HIGH (ref 70–99)
Potassium: 4.1 mmol/L (ref 3.5–5.1)
Sodium: 131 mmol/L — ABNORMAL LOW (ref 135–145)
Total Bilirubin: 0.9 mg/dL (ref 0.3–1.2)
Total Protein: 8.6 g/dL — ABNORMAL HIGH (ref 6.5–8.1)

## 2021-09-10 LAB — TROPONIN I (HIGH SENSITIVITY): Troponin I (High Sensitivity): 3 ng/L (ref <18)

## 2021-09-10 LAB — D-DIMER, QUANTITATIVE: D-Dimer, Quant: 1.02 ug/mL-FEU — ABNORMAL HIGH (ref 0.00–0.50)

## 2021-09-10 MED ORDER — BENZONATATE 100 MG PO CAPS: 100.0000 mg | ORAL_CAPSULE | Freq: Three times a day (TID) | ORAL | 1 refills | Status: DC | PRN

## 2021-09-10 MED ORDER — ALBUTEROL SULFATE HFA 108 (90 BASE) MCG/ACT IN AERS: 1.0000 | INHALATION_SPRAY | Freq: Four times a day (QID) | RESPIRATORY_TRACT | 0 refills | Status: AC | PRN

## 2021-09-10 MED ORDER — LIDOCAINE HCL (PF) 1 % IJ SOLN
2.1000 mL | Freq: Once | INTRAMUSCULAR | Status: AC
  Administered 2021-09-10: 2.1 mL
  Filled 2021-09-10: qty 5

## 2021-09-10 MED ORDER — IOHEXOL 350 MG/ML SOLN
75.0000 mL | Freq: Once | INTRAVENOUS | Status: AC | PRN
  Administered 2021-09-10: 75 mL via INTRAVENOUS

## 2021-09-10 MED ORDER — AZITHROMYCIN 250 MG PO TABS: ORAL_TABLET | ORAL | 0 refills | Status: DC

## 2021-09-10 MED ORDER — CEFTRIAXONE SODIUM 1 G IJ SOLR
1.0000 g | Freq: Once | INTRAMUSCULAR | Status: AC
  Administered 2021-09-10: 1 g via INTRAMUSCULAR
  Filled 2021-09-10: qty 10

## 2021-09-10 MED ORDER — PREDNISONE 10 MG (21) PO TBPK: ORAL_TABLET | Freq: Every day | ORAL | 0 refills | Status: DC

## 2021-09-10 NOTE — ED Provider Notes (Signed)
[facility]    CSN: [id number] Arrival date & time: 09/10/21  0857      History   Chief Complaint No chief complaint on file.   HPI Lori Dougherty is a 71 y.o. female.   Patient presents today with left-sided chest pain left pain to upper abdomen rib area and cough.  States that she had COVID 2 months ago and still has a persistent cough.  Patient seen her PCP last month for similar symptoms a chest x-ray was completed and she has been taking antibiotics for 2 weeks.  Patient states that she still has a low-grade fever and the cough still persist so it is concerning to her for pneumonia.  Has been taking an albuterol inhaler with minimal relief.   Past Medical History:  Diagnosis Date   GERD (gastroesophageal reflux disease)    Hypertension    Squamous cell skin cancer     Patient Active Problem List   Diagnosis Date Noted   Hyperlipidemia 08/23/2018   Hypertension 08/23/2018   Psoriasis 08/23/2018   Gross hematuria 08/23/2018   Personal history of tobacco use 09/28/2017   Pulmonary nodule, left 09/28/2017   Chest pain 01/06/2016   Family history of colon cancer 04/08/2015   Gastroesophageal reflux disease 04/08/2015    Past Surgical History:  Procedure Laterality Date   ABDOMINAL HYSTERECTOMY      OB History   No obstetric history on file.      Home Medications    Prior to Admission medications   Medication Sig Start Date End Date Taking? Authorizing Provider  acyclovir (ZOVIRAX) 200 MG capsule Take by mouth. 01/27/17  Yes [provider]  albuterol (VENTOLIN HFA) 108 (90 Base) MCG/ACT inhaler Inhale 1-2 puffs into the lungs every 6 (six) hours as needed for wheezing or shortness of breath. 09/10/21  Yes Marney Setting, NP  Cholecalciferol (VITAMIN D3) 25 MCG (1000 UT) CAPS Take by mouth.   Yes [provider]  estradiol (ESTRACE) 0.1 MG/GM vaginal cream Insert pea size amount vaginally nightly x 2 weeks, then every  other night x 2 weeks, then twice weekly for maintenance 08/15/18  Yes [provider]  lisinopril-hydrochlorothiazide (PRINZIDE,ZESTORETIC) 20-12.5 MG tablet Take 1 tablet by mouth daily.   Yes [provider]  omeprazole (PRILOSEC) 20 MG capsule Take 20 mg by mouth 2 (two) times daily before a meal.   Yes [provider]  pravastatin (PRAVACHOL) 40 MG tablet Take 40 mg by mouth daily.   Yes [provider]  predniSONE (STERAPRED UNI-PAK 21 TAB) 10 MG (21) TBPK tablet Take by mouth daily. Take 6 tabs by mouth daily  for 2 days, then 5 tabs for 2 days, then 4 tabs for 2 days, then 3 tabs for 2 days, 2 tabs for 2 days, then 1 tab by mouth daily for 2 days 09/10/21  Yes Marney Setting, NP  raloxifene (EVISTA) 60 MG tablet TK 1 T PO ONCE D FOR OSTEOPOROSIS 04/19/18  Yes [provider]  Saline GEL Apply topically. 09/06/16  Yes [provider]  traZODone (DESYREL) 50 MG tablet Take by mouth. 04/19/18  Yes [provider]  doxycycline (VIBRAMYCIN) 100 MG capsule Take 1 capsule (100 mg total) by mouth 2 (two) times daily. 08/25/18   Marylene Land, NP  sulfamethoxazole-trimethoprim (BACTRIM DS) 800-160 MG tablet Take 1 tablet by mouth 2 (two) times daily. 10/10/19   Norval Gable, MD    Family History Family History  Problem Relation  Age of Onset   Breast cancer Neg Hx     Social History Social History   Tobacco Use   Smoking status: Former    Types: Cigarettes    Quit date: 01/02/2007    Years since quitting: 14.6   Smokeless tobacco: Never  Vaping Use   Vaping Use: Never used  Substance Use Topics   Alcohol use: Yes    Comment: couple times weekly   Drug use: No     Allergies   Patient has no known allergies.   Review of Systems Review of Systems  Constitutional:  Positive for fever.       Low-grade  HENT: Negative.    Respiratory:  Positive for cough and shortness of breath. Negative for wheezing.    Cardiovascular:  Positive for chest pain.       Left-sided chest pain intermittent for 2 months  Gastrointestinal:  Positive for abdominal pain.       Left abdominal pain more so with coughing  Genitourinary: Negative.   Skin: Negative.   Neurological: Negative.     Physical Exam Triage Vital Signs ED Triage Vitals  Enc Vitals Group     BP 09/10/21 0911 (!) 131/53     Pulse Rate 09/10/21 0911 (!) 117     Resp 09/10/21 0911 18     Temp 09/10/21 0911 99.2 F (37.3 C)     Temp Source 09/10/21 0911 Oral     SpO2 09/10/21 0911 95 %     Weight 09/10/21 0909 130 lb (59 kg)     Height 09/10/21 0909 5\' 5"  (1.651 m)     Head Circumference --      Peak Flow --      Pain Score 09/10/21 0909 8     Pain Loc --      Pain Edu? --      Excl. in [place]? --    No data found.  Updated Vital Signs BP (!) 131/53 (BP Location: Left Arm)    Pulse (!) 117    Temp 99.2 F (37.3 C) (Oral)    Resp 18    Ht 5\' 5"  (1.651 m)    Wt 130 lb (59 kg)    SpO2 95%    BMI 21.63 kg/m   Visual Acuity Right Eye Distance:   Left Eye Distance:   Bilateral Distance:    Right Eye Near:   Left Eye Near:    Bilateral Near:     Physical Exam Constitutional:      Appearance: Normal appearance. She is normal weight.  HENT:     Nose: Nose normal.  Eyes:     Pupils: Pupils are equal, round, and reactive to light.  Cardiovascular:     Rate and Rhythm: Tachycardia present.  Pulmonary:     Effort: Pulmonary effort is normal.     Breath sounds: Normal breath sounds.  Abdominal:     General: Abdomen is flat.     Tenderness: There is abdominal tenderness. There is no right CVA tenderness, left CVA tenderness or rebound.     Comments: Left upper quadrantrib  Musculoskeletal:        General: Normal range of motion.  Skin:    General: Skin is warm.  Neurological:     General: No focal deficit present.     Mental Status: She is alert.     UC Treatments / Results  Labs (all labs ordered are listed, but only  abnormal results are displayed) Labs  Reviewed  COMPREHENSIVE METABOLIC PANEL - Abnormal; Notable for the following components:      Result Value   Sodium 131 (*)    Glucose, Bld 149 (*)    Total Protein 8.6 (*)    All other components within normal limits  CBC WITH DIFFERENTIAL/PLATELET - Abnormal; Notable for the following components:   WBC 13.6 (*)    Neutro Abs 11.4 (*)    All other components within normal limits  D-DIMER, QUANTITATIVE - Abnormal; Notable for the following components:   D-Dimer, Quant 1.02 (*)    All other components within normal limits    EKG   Radiology DG Chest 2 View  Result Date: 09/10/2021 CLINICAL DATA:  Pain with deep breaths, radiating to the left shoulder. EXAM: CHEST - 2 VIEW COMPARISON:  Chest radiograph 08/11/2021 FINDINGS: The heart size and mediastinal contours are within normal limits. Biapical pleural thickening. No focal consolidation, pleural effusion, or pneumothorax. The visualized skeletal structures are unremarkable. IMPRESSION: No active cardiopulmonary disease. Electronically Signed   By: Ileana Roup M.D.   On: 09/10/2021 09:44    Procedures ED EKG  Date/Time: 09/10/2021 10:29 AM Performed by: Marney Setting, NP Authorized by: Marney Setting, NP   ECG reviewed by ED Physician in the absence of a cardiologist: no   Previous ECG:    Previous ECG:  Unavailable Interpretation:    Interpretation: normal   Rate:    ECG rate assessment: normal   Rhythm:    Rhythm: sinus rhythm   Ectopy:    Ectopy: none   QRS:    QRS axis:  Normal ST segments:    ST segments:  Normal T waves:    T waves: normal   (including critical care time)  Medications Ordered in UC Medications - No data to display  Initial Impression / Assessment and Plan / UC Course  I have reviewed the triage vital signs and the nursing notes.  Pertinent labs & imaging results that were available during my care of the patient were reviewed by me and  considered in my medical decision making (see chart for details).     We have sent off a D-dimer level for you.  If this returns back positive for elevated we will call you and you will need to be seen in the emergency room for CT scan. Use albuterol inhaler as needed for shortness of breath or cough. Take prednisone to help with any type of inflammation If symptoms become worse you need to be seen in the emergency room. Your symptoms may be lingering from COVID Take Tylenol Motrin as needed for pain Patient's D-dimer test returned and is elevated.  Patient will be called and expressed to be seen for CT scan and further labs in the emergency room. Final Clinical Impressions(s) / UC Diagnoses   Final diagnoses:  Subacute cough  Chest pain, unspecified type  Left upper quadrant abdominal pain     Discharge Instructions      We have sent off a D-dimer level for you.  If this returns back positive for elevated we will call you and you will need to be seen in the emergency room for CT scan. Use albuterol inhaler as needed for shortness of breath or cough. Take prednisone to help with any type of inflammation If symptoms become worse you need to be seen in the emergency room. Your symptoms may be lingering from COVID Take Tylenol Motrin as needed for pain    ED Prescriptions  Medication Sig Dispense Auth. Provider   predniSONE (STERAPRED UNI-PAK 21 TAB) 10 MG (21) TBPK tablet Take by mouth daily. Take 6 tabs by mouth daily  for 2 days, then 5 tabs for 2 days, then 4 tabs for 2 days, then 3 tabs for 2 days, 2 tabs for 2 days, then 1 tab by mouth daily for 2 days 42 tablet Morley Kos L, NP   albuterol (VENTOLIN HFA) 108 (90 Base) MCG/ACT inhaler Inhale 1-2 puffs into the lungs every 6 (six) hours as needed for wheezing or shortness of breath. 18 g Marney Setting, NP      PDMP not reviewed this encounter.   Marney Setting, NP 09/10/21 [phone]

## 2021-09-10 NOTE — ED Triage Notes (Signed)
Pt c/o left upper abdominal pain x3days. Pt states that breathing deeply hurts and that the pain is going up her left shoulder.   Pt states that she did not eat anything different than normal and her last bowel movement was yesterday.

## 2021-09-10 NOTE — ED Provider Notes (Signed)
[facility] Provider Note  Patient Contact: 7:06 PM (approximate)   History   Chest Pain   HPI  Lori Dougherty is a 71 y.o. female who presents the emergency department complaining of pleuritic chest pain, cough, shortness of breath, low-grade fever.  Patient states that she had COVID roughly 2 months ago.  She has had ongoing cough for 2 months.  She has been assessed, treated once for postviral pneumonia.  There was no change in her symptoms and she was diagnosed with COVID long-haul symptoms.  Patient developed low-grade fever, shortness of breath, and pleuritic chest pain 2 days ago.  Was seen in urgent care today and had a positive D-dimer.  She was referred to the ED for evaluation for PE chest.     Physical Exam   Triage Vital Signs: ED Triage Vitals  Enc Vitals Group     BP 09/10/21 1700 (!) 161/72     Pulse Rate 09/10/21 1700 78     Resp 09/10/21 1700 18     Temp 09/10/21 1700 99.2 F (37.3 C)     Temp Source 09/10/21 1700 Oral     SpO2 09/10/21 1700 94 %     Weight --      Height 09/10/21 1701 5\' 5"  (1.651 m)     Head Circumference --      Peak Flow --      Pain Score 09/10/21 1701 2     Pain Loc --      Pain Edu? --      Excl. in [place]? --     Most recent vital signs: Vitals:   09/10/21 1700  BP: (!) 161/72  Pulse: 78  Resp: 18  Temp: 99.2 F (37.3 C)  SpO2: 94%     General: Alert and in no acute distress. ENT:      Ears:       Nose: No congestion/rhinnorhea.      Mouth/Throat: Mucous membranes are moist.  Cardiovascular:  Good peripheral perfusion.  No murmurs, rubs, gallops. Respiratory: Normal respiratory effort without tachypnea or retractions. Lungs CTAB. Good air entry to the bases with no decreased or absent breath sounds. Musculoskeletal: Full range of motion to all extremities.  Neurologic:  No gross focal neurologic deficits are appreciated.  Skin:   No rash noted Other:   ED Results / Procedures /  Treatments   Labs (all labs ordered are listed, but only abnormal results are displayed) Labs Reviewed  TROPONIN I (HIGH SENSITIVITY)  TROPONIN I (HIGH SENSITIVITY)     EKG  ED ECG REPORT I, Charline Bills Gerome Kokesh,  personally viewed and interpreted this ECG.   Date: 09/10/2021  EKG Time: 1701 hrs.  Rate: 72 bpm  Rhythm: normal sinus rhythm, compared to previous EKG from today, P wave has changed morphology, P wave appears somewhat biphasic with possible ectopic atrial rhythm  Axis: Normal axis  Intervals:none  ST&T Change: No ST elevation or depression noted  Compared to previous EKG from earlier today, patient is still in sinus rhythm but does have P wave change identified which appears to be biphasic.  Possible ectopic atrial rhythm.  No STEMI.  Nonspecific ST segment changes in the anterolateral leads.    RADIOLOGY  I personally viewed and evaluated these images as part of my medical decision making, as well as reviewing the written report by the radiologist.  ED Provider Interpretation: I reviewed today's chest x-ray from urgent care with no acute cardiopulmonary findings.  Patient had a CT scan performed this evening which revealed no evidence of PE.  Patient has findings consistent with bronchopneumonia.  There is a stable lesion identified in the left lower lobe of the lung on CT.  DG Chest 2 View  Result Date: 09/10/2021 CLINICAL DATA:  Pain with deep breaths, radiating to the left shoulder. EXAM: CHEST - 2 VIEW COMPARISON:  Chest radiograph 08/11/2021 FINDINGS: The heart size and mediastinal contours are within normal limits. Biapical pleural thickening. No focal consolidation, pleural effusion, or pneumothorax. The visualized skeletal structures are unremarkable. IMPRESSION: No active cardiopulmonary disease. Electronically Signed   By: Ileana Roup M.D.   On: 09/10/2021 09:44   CT Angio Chest PE W and/or Wo Contrast  Result Date: 09/10/2021 CLINICAL DATA:  Pulmonary  embolism suspected. Positive D-dimer. Left upper abdominal pain. EXAM: CT ANGIOGRAPHY CHEST WITH CONTRAST TECHNIQUE: Multidetector CT imaging of the chest was performed using the standard protocol during bolus administration of intravenous contrast. Multiplanar CT image reconstructions and MIPs were obtained to evaluate the vascular anatomy. RADIATION DOSE REDUCTION: This exam was performed according to the departmental dose-optimization program which includes automated exposure control, adjustment of the mA and/or kV according to patient size and/or use of iterative reconstruction technique. CONTRAST:  5mL OMNIPAQUE IOHEXOL 350 MG/ML SOLN COMPARISON:  Chest radiography same day.  Abdominal CT 08/30/2018 FINDINGS: Cardiovascular: Heart size upper limits of normal. No pericardial effusion. No visible coronary artery calcification. Minimal aortic atherosclerotic calcification. Pulmonary arterial opacification is excellent. There are no pulmonary emboli. Mediastinum/Nodes: No mediastinal or hilar mass or lymphadenopathy. Lungs/Pleura: No underlying emphysema. Chronic 5 mm well-circumscribed pulmonary nodule in the medial posterior left lower lobe axial image 97 is unchanged since 08/30/2018 in therefore quite likely benign. Chronic calcified granuloma in the posterior right lower lobe axial image 95 is also unchanged. There are minimal patchy densities at both lung bases favored to represent mild bibasilar pneumonia in this setting. No lobar consolidation or collapse. No pleural effusion. Upper Abdomen: Normal except for a 17 mm cyst in the upper pole of the left kidney. Musculoskeletal: Normal Review of the MIP images confirms the above findings. IMPRESSION: Mild patchy densities at both lung bases most consistent with mild bronchopneumonia. No lobar consolidation or collapse. No pulmonary emboli. Aortic Atherosclerosis (ICD10-I70.0). This would be categorized as mild. Stable 5 mm well-circumscribed nodule in the  medial left lower lobe, unchanged since 08/30/2018. This confirms stability and benign nature. Therefore, no further specific follow-up is suggested for this nodule at this time. No change in a 2-3 mm calcified granuloma in the posterior right lower lobe. Electronically Signed   By: Nelson Chimes M.D.   On: 09/10/2021 17:50    PROCEDURES:  Critical Care performed: No  Procedures   MEDICATIONS ORDERED IN ED: Medications  cefTRIAXone (ROCEPHIN) injection 1 g (has no administration in time range)  lidocaine (PF) (XYLOCAINE) 1 % injection 2.1 mL (has no administration in time range)  iohexol (OMNIPAQUE) 350 MG/ML injection 75 mL (75 mLs Intravenous Contrast Given 09/10/21 1725)     IMPRESSION / MDM / ASSESSMENT AND PLAN / ED COURSE  I reviewed the triage vital signs and the nursing notes.                              Differential diagnosis includes, but is not limited to, PE, pneumonia, bronchitis    Patient's diagnosis is consistent with nonspecific chest pain, bronchopneumonia.  Patient presented  to the ED with pleuritic chest pain, low-grade fever, ongoing cough and increasing shortness of breath.  Patient been seen at urgent care earlier today.  Work-up was reassuring with the exception of elevated D-dimer from earlier.  D-dimer level was 1.02.  CBC and CMP are reassuring.  Patient was referred to the ED for evaluation of this pleuritic pain in the setting of positive D-dimer.  Peers that patient is a COVID long-haul patient having had symptoms since her diagnosis of COVID over 2 months ago.  Troponin was reassuring, EKG is reassuring at this time.  She had a CT scan which revealed no PE but does have evidence of bronchopneumonia.  With a low-grade fever, shortness of breath, cough and pleuritic pain I will treat for pneumonia.  Return precautions discussed with the patient.  No further work-up at this time..  Patient is given ED precautions to return to the ED for any worsening or new  symptoms.        FINAL CLINICAL IMPRESSION(S) / ED DIAGNOSES   Final diagnoses:  Nonspecific chest pain  Bronchopneumonia     Rx / DC Orders   ED Discharge Orders          Ordered    azithromycin (ZITHROMAX Z-PAK) 250 MG tablet        09/10/21 1914    benzonatate (TESSALON PERLES) 100 MG capsule  3 times daily PRN        09/10/21 1914             Note:  This document was prepared using Dragon voice recognition software and may include unintentional dictation errors.   Brynda Peon 09/10/21 1914    Harvest Dark, MD 09/10/21 2248

## 2021-09-10 NOTE — Discharge Instructions (Addendum)
We have sent off a D-dimer level for you.  If this returns back positive for elevated we will call you and you will need to be seen in the emergency room for CT scan. Use albuterol inhaler as needed for shortness of breath or cough. Take prednisone to help with any type of inflammation If symptoms become worse you need to be seen in the emergency room. Your symptoms may be lingering from COVID Take Tylenol Motrin as needed for pain

## 2021-09-10 NOTE — ED Triage Notes (Signed)
Patient to ER via POV from urgent care. Reports x3 days of left sided lower rib pain, worse with deep breaths. Pain moves into left shoulder/. Reports fevers at home.   Was sent for a possible CTA due to positive d-dimer today at urgent care.

## 2021-09-22 ENCOUNTER — Ambulatory Visit: Payer: Medicare Other

## 2021-09-29 ENCOUNTER — Ambulatory Visit
Admission: RE | Admit: 2021-09-29 | Discharge: 2021-09-29 | Disposition: A | Discharge status: Home / Self Care | Payer: Medicare Other | Source: Ambulatory Visit | Attending: Family Medicine | Admitting: Family Medicine

## 2021-09-29 ENCOUNTER — Other Ambulatory Visit: Payer: Self-pay

## 2021-09-29 DIAGNOSIS — Z1231 Encounter for screening mammogram for malignant neoplasm of breast: Secondary | ICD-10-CM | POA: Insufficient documentation

## 2022-05-24 ENCOUNTER — Other Ambulatory Visit: Payer: Self-pay

## 2022-05-25 ENCOUNTER — Other Ambulatory Visit: Payer: Self-pay

## 2022-05-25 ENCOUNTER — Ambulatory Visit (INDEPENDENT_AMBULATORY_CARE_PROVIDER_SITE_OTHER): Payer: Medicare Other | Admitting: Gastroenterology

## 2022-05-25 ENCOUNTER — Encounter: Payer: Self-pay | Admitting: Gastroenterology

## 2022-05-25 VITALS — BP 139/73 | HR 65 | Temp 97.7°F | Ht 65.0 in | Wt 129.4 lb

## 2022-05-25 DIAGNOSIS — K219 Gastro-esophageal reflux disease without esophagitis: Secondary | ICD-10-CM

## 2022-05-25 DIAGNOSIS — R1013 Epigastric pain: Secondary | ICD-10-CM | POA: Diagnosis not present

## 2022-05-25 DIAGNOSIS — Z8719 Personal history of other diseases of the digestive system: Secondary | ICD-10-CM

## 2022-05-25 NOTE — Progress Notes (Signed)
Cephas Darby, MD [address]  [place]  [place], [place] [address]  Main: [phone]  Fax: [phone]    Gastroenterology Consultation  Referring Provider:     Elza Rafter, * Primary Care Physician:  Elza Rafter, MD Primary Gastroenterologist:  Dr. Cephas Darby Reason for Consultation: Dyspepsia        HPI:   Lori Dougherty is a 71 y.o. female referred by Elza Rafter, MD for consultation & management of 3 months history of gnawing pain in epigastric area associated with frequent belching, feeling full in her esophagus, associated with regurgitation of bile or acid.  Patient reports history of chronic GERD and Barrett's esophagus for which she has been taking omeprazole 20 mg daily.  Patient did not try to increase PPI dose for new symptoms.  She denies any change in bowel habits.  She does report abdominal bloating, feeling gassy.  She denies any weight loss.  She reports that she was originally referred to [facility], did not have an appointment until January 8.  Therefore, she self-referred to [place] practice.  She tells me that she actually got an appointment for endoscopy for Monday next week.  Patient had upper endoscopy and colonoscopy in [place] few years ago, last colonoscopy in 2016  Patient does not smoke or drink alcohol  NSAIDs: None  Antiplts/Anticoagulants/Anti thrombotics: None  GI Procedures: Colonoscopy in 2016, normal  Past Medical History:  Diagnosis Date   GERD (gastroesophageal reflux disease)    Hypertension    Squamous cell skin cancer     Past Surgical History:  Procedure Laterality Date   ABDOMINAL HYSTERECTOMY       Current Outpatient Medications:    albuterol (VENTOLIN HFA) 108 (90 Base) MCG/ACT inhaler, Inhale 1-2 puffs into the lungs every 6 (six) hours as needed for wheezing or shortness of breath., Disp: 18 g, Rfl: 0   atorvastatin (LIPITOR) 40 MG tablet, Take 1 tablet by mouth  daily., Disp: , Rfl:    azelastine (ASTELIN) 0.1 % nasal spray, Place into the nose., Disp: , Rfl:    Cholecalciferol (VITAMIN D3) 25 MCG (1000 UT) CAPS, Take by mouth., Disp: , Rfl:    dorzolamide-timolol (COSOPT) 2-0.5 % ophthalmic solution, Place 1 drop into both eyes 2 (two) times daily., Disp: , Rfl:    fluticasone (FLONASE) 50 MCG/ACT nasal spray, Place 2 sprays into both nostrils daily., Disp: , Rfl:    latanoprost (XALATAN) 0.005 % ophthalmic solution, Place 1 drop into both eyes at bedtime., Disp: , Rfl:    lisinopril (ZESTRIL) 30 MG tablet, Take 30 mg by mouth daily., Disp: , Rfl:    omeprazole (PRILOSEC) 20 MG capsule, Take 20 mg by mouth daily., Disp: , Rfl:    Oxymetazoline HCl 1 % CREA, Apply topically., Disp: , Rfl:    pravastatin (PRAVACHOL) 40 MG tablet, Take 40 mg by mouth daily., Disp: , Rfl:    raloxifene (EVISTA) 60 MG tablet, TK 1 T PO ONCE D FOR OSTEOPOROSIS, Disp: , Rfl:    Saline GEL, Apply topically., Disp: , Rfl:    traZODone (DESYREL) 150 MG tablet, Take 150 mg by mouth at bedtime., Disp: , Rfl:    tretinoin (RETIN-A) 0.05 % cream, Apply topically at bedtime., Disp: , Rfl:    valACYclovir (VALTREX) 500 MG tablet, Take 500 mg by mouth 2 (two) times daily., Disp: , Rfl:    lisinopril-hydrochlorothiazide (PRINZIDE,ZESTORETIC) 20-12.5 MG tablet, Take 1 tablet by mouth daily., Disp: , Rfl:  Family History  Problem Relation Age of Onset   Breast cancer Neg Hx      Social History   Tobacco Use   Smoking status: Former    Types: Cigarettes    Quit date: 01/02/2007    Years since quitting: 15.4   Smokeless tobacco: Never  Vaping Use   Vaping Use: Never used  Substance Use Topics   Alcohol use: Yes    Comment: couple times weekly   Drug use: No    Allergies as of 05/25/2022   (No Known Allergies)    Review of Systems:    All systems reviewed and negative except where noted in HPI.   Physical Exam:  BP 139/73 (BP Location: Left Arm, Patient Position:  Sitting, Cuff Size: Normal)   Pulse 65   Temp 97.7 F (36.5 C) (Oral)   Ht '5\' 5"'$  (1.651 m)   Wt 129 lb 6 oz (58.7 kg)   BMI 21.53 kg/m  No LMP recorded. Patient has had a hysterectomy.  General:   Alert,  Well-developed, well-nourished, pleasant and cooperative in NAD Head:  Normocephalic and atraumatic. Eyes:  Sclera clear, no icterus.   Conjunctiva pink. Ears:  Normal auditory acuity. Nose:  No deformity, discharge, or lesions. Mouth:  No deformity or lesions,oropharynx pink & moist. Neck:  Supple; no masses or thyromegaly. Lungs:  Respirations even and unlabored.  Clear throughout to auscultation.   No wheezes, crackles, or rhonchi. No acute distress. Heart:  Regular rate and rhythm; no murmurs, clicks, rubs, or gallops. Abdomen:  Normal bowel sounds. Soft, non-tender and mildly distended, tympanic without masses, hepatosplenomegaly or hernias noted.  No guarding or rebound tenderness.   Rectal: Not performed Msk:  Symmetrical without gross deformities. Good, equal movement & strength bilaterally. Pulses:  Normal pulses noted. Extremities:  No clubbing or edema.  No cyanosis. Neurologic:  Alert and oriented x3;  grossly normal neurologically. Skin:  Intact without significant lesions or rashes. No jaundice. Psych:  Alert and cooperative. Normal mood and affect.  Imaging Studies: Reviewed  Assessment and Plan:   Lori Dougherty is a 71 y.o. female with history of chronic GERD on low-dose PPI, Barrett's esophagus, hypertension is seen in consultation for 3 months history of dyspepsia  Recommend upper endoscopy with duodenal, gastric and esophageal biopsies Advised patient not to have duplication of care seeing multiple gastroenterologists, 1 for procedure and 1 for office visit.  And, I have advised her that if she wishes to establish care with me, it is not in her best interest to undergo upper endoscopy at [facility] with a different gastroenterologist.  Patient agreed to  undergo upper endoscopy with me and continue follow-up with me. Continue omeprazole 20 mg daily for now  Follow up based on the EGD results   Cephas Darby, MD

## 2022-05-26 ENCOUNTER — Ambulatory Visit: Payer: Medicare Other

## 2022-05-26 ENCOUNTER — Ambulatory Visit
Admission: RE | Admit: 2022-05-26 | Discharge: 2022-05-26 | Disposition: A | Discharge status: Home / Self Care | Payer: Medicare Other | Attending: Gastroenterology | Admitting: Gastroenterology

## 2022-05-26 ENCOUNTER — Encounter: Payer: Self-pay | Admitting: Gastroenterology

## 2022-05-26 ENCOUNTER — Encounter
Admission: RE | Disposition: A | Discharge status: Home / Self Care | Payer: Self-pay | Source: Home / Self Care | Attending: Gastroenterology

## 2022-05-26 DIAGNOSIS — K219 Gastro-esophageal reflux disease without esophagitis: Secondary | ICD-10-CM | POA: Insufficient documentation

## 2022-05-26 DIAGNOSIS — I10 Essential (primary) hypertension: Secondary | ICD-10-CM | POA: Diagnosis not present

## 2022-05-26 DIAGNOSIS — Z87891 Personal history of nicotine dependence: Secondary | ICD-10-CM | POA: Diagnosis not present

## 2022-05-26 DIAGNOSIS — R1013 Epigastric pain: Secondary | ICD-10-CM | POA: Insufficient documentation

## 2022-05-26 HISTORY — PX: ESOPHAGOGASTRODUODENOSCOPY (EGD) WITH PROPOFOL: SHX5813

## 2022-05-26 SURGERY — ESOPHAGOGASTRODUODENOSCOPY (EGD) WITH PROPOFOL
Anesthesia: General

## 2022-05-26 MED ORDER — PROPOFOL 10 MG/ML IV BOLUS
INTRAVENOUS | Status: DC | PRN
  Administered 2022-05-26: 70 mg via INTRAVENOUS
  Administered 2022-05-26: 10 mg via INTRAVENOUS

## 2022-05-26 MED ORDER — LIDOCAINE HCL (CARDIAC) PF 100 MG/5ML IV SOSY
PREFILLED_SYRINGE | INTRAVENOUS | Status: DC | PRN
  Administered 2022-05-26: 50 mg via INTRAVENOUS

## 2022-05-26 MED ORDER — PROPOFOL 500 MG/50ML IV EMUL
INTRAVENOUS | Status: DC | PRN
  Administered 2022-05-26: 140 ug/kg/min via INTRAVENOUS

## 2022-05-26 MED ORDER — SODIUM CHLORIDE 0.9 % IV SOLN: INTRAVENOUS | Status: DC

## 2022-05-26 NOTE — H&P (Signed)
Lori Darby, MD [address]  [place]  [place], [place] [address]  Main: [phone]  Fax: [phone] Pager: [phone]  Primary Care Physician:  Elza Rafter, MD Primary Gastroenterologist:  Dr. Cephas Dougherty  Pre-Procedure History & Physical: HPI:  Arlicia Paquette is a 71 y.o. female is here for an upper endoscopy   Past Medical History:  Diagnosis Date   GERD (gastroesophageal reflux disease)    Hypertension    Squamous cell skin cancer     Past Surgical History:  Procedure Laterality Date   ABDOMINAL HYSTERECTOMY     COLONOSCOPY WITH PROPOFOL      Prior to Admission medications   Medication Sig Start Date End Date Taking? Authorizing Provider  albuterol (VENTOLIN HFA) 108 (90 Base) MCG/ACT inhaler Inhale 1-2 puffs into the lungs every 6 (six) hours as needed for wheezing or shortness of breath. 09/10/21   Marney Setting, NP  atorvastatin (LIPITOR) 40 MG tablet Take 1 tablet by mouth daily. 09/04/21   [provider]  azelastine (ASTELIN) 0.1 % nasal spray Place into the nose. 07/22/21 07/22/22  [provider]  Cholecalciferol (VITAMIN D3) 25 MCG (1000 UT) CAPS Take by mouth.    [provider]  dorzolamide-timolol (COSOPT) 2-0.5 % ophthalmic solution Place 1 drop into both eyes 2 (two) times daily. 11/09/21   [provider]  fluticasone (FLONASE) 50 MCG/ACT nasal spray Place 2 sprays into both nostrils daily. 07/22/21 07/22/22  [provider]  latanoprost (XALATAN) 0.005 % ophthalmic solution Place 1 drop into both eyes at bedtime. 11/11/21   [provider]  lisinopril (ZESTRIL) 30 MG tablet Take 30 mg by mouth daily. 07/27/21   [provider]  lisinopril-hydrochlorothiazide (PRINZIDE,ZESTORETIC) 20-12.5 MG tablet Take 1 tablet by mouth daily.    [provider]  omeprazole (PRILOSEC) 20 MG capsule Take 20 mg by mouth daily.    [provider]  Oxymetazoline HCl 1 %  CREA Apply topically. 11/25/21   [provider]  pravastatin (PRAVACHOL) 40 MG tablet Take 40 mg by mouth daily.    [provider]  raloxifene (EVISTA) 60 MG tablet TK 1 T PO ONCE D FOR OSTEOPOROSIS 04/19/18   [provider]  Saline GEL Apply topically. 09/06/16   [provider]  traZODone (DESYREL) 150 MG tablet Take 150 mg by mouth at bedtime. 11/10/21   [provider]  tretinoin (RETIN-A) 0.05 % cream Apply topically at bedtime. 12/22/20   [provider]  valACYclovir (VALTREX) 500 MG tablet Take 500 mg by mouth 2 (two) times daily. 04/22/22 04/22/23  [provider]    Allergies as of 05/25/2022   (No Known Allergies)    Family History  Problem Relation Age of Onset   Breast cancer Neg Hx     Social History   Socioeconomic History   Marital status: Married    Spouse name: Not on file   Number of children: Not on file   Years of education: Not on file   Highest education level: Not on file  Occupational History   Not on file  Tobacco Use   Smoking status: Former    Types: Cigarettes    Quit date: 01/02/2007    Years since quitting: 15.4   Smokeless tobacco: Never  Vaping Use   Vaping Use: Never used  Substance and Sexual Activity   Alcohol use: Yes    Comment: couple times weekly   Drug use: No   Sexual  activity: Yes    Birth control/protection: Post-menopausal, Surgical  Other Topics Concern   Not on file  Social History Narrative   Not on file   Social Determinants of Health   Financial Resource Strain: Not on file  Food Insecurity: Not on file  Transportation Needs: Not on file  Physical Activity: Not on file  Stress: Not on file  Social Connections: Not on file  Intimate Partner Violence: Not on file    Review of Systems: See HPI, otherwise negative ROS  Physical Exam: BP (!) 169/78   Pulse 68   Temp 97.9 F (36.6 C) (Temporal)   Resp 16   SpO2 99%  General:   Alert,  pleasant and  cooperative in NAD Head:  Normocephalic and atraumatic. Neck:  Supple; no masses or thyromegaly. Lungs:  Clear throughout to auscultation.    Heart:  Regular rate and rhythm. Abdomen:  Soft, nontender and nondistended. Normal bowel sounds, without guarding, and without rebound.   Neurologic:  Alert and  oriented x4;  grossly normal neurologically.  Impression/Plan: Lelaina Oatis is here for an upper endoscopy to be performed for dyspepsia  Risks, benefits, limitations, and alternatives regarding upper endoscopy have been reviewed with the patient.  Questions have been answered.  All parties agreeable.   Sherri Sear, MD  05/26/2022, 10:49 AM

## 2022-05-26 NOTE — Transfer of Care (Signed)
Immediate Anesthesia Transfer of Care Note  Patient: Idell Pickles  Procedure(s) Performed: ESOPHAGOGASTRODUODENOSCOPY (EGD) WITH PROPOFOL  Patient Location: PACU and Endoscopy Unit  Anesthesia Type:General  Level of Consciousness: awake  Airway & Oxygen Therapy: Patient Spontanous Breathing  Post-op Assessment: Report given to RN  Post vital signs: Reviewed  Last Vitals:  Vitals Value Taken Time  BP 139/81 05/26/22 1105  Temp 36.4 C 05/26/22 1105  Pulse 84 05/26/22 1105  Resp 16 05/26/22 1105  SpO2 99 % 05/26/22 1105    Last Pain:  Vitals:   05/26/22 1105  TempSrc: Tympanic  PainSc: 0-No pain         Complications: No notable events documented.

## 2022-05-26 NOTE — Anesthesia Procedure Notes (Signed)
Procedure Name: MAC Date/Time: 05/26/2022 10:55 AM  Performed by: Tollie Eth, CRNAPre-anesthesia Checklist: Patient identified, Emergency Drugs available, Suction available and Patient being monitored Patient Re-evaluated:Patient Re-evaluated prior to induction Oxygen Delivery Method: Nasal cannula Induction Type: IV induction Placement Confirmation: positive ETCO2

## 2022-05-26 NOTE — Anesthesia Preprocedure Evaluation (Signed)
Anesthesia Evaluation  Patient identified by MRN, date of birth, ID band Patient awake    Reviewed: Allergy & Precautions, H&P , NPO status , Patient's Chart, lab work & pertinent test results, reviewed documented beta blocker date and time   History of Anesthesia Complications Negative for: history of anesthetic complications  Airway Mallampati: II  TM Distance: >3 FB Neck ROM: full    Dental  (+) Dental Advidsory Given, Caps, Teeth Intact   Pulmonary neg pulmonary ROS, former smoker   Pulmonary exam normal breath sounds clear to auscultation       Cardiovascular Exercise Tolerance: Good hypertension, (-) angina (-) Past MI and (-) Cardiac Stents Normal cardiovascular exam(-) dysrhythmias (-) Valvular Problems/Murmurs Rhythm:regular Rate:Normal     Neuro/Psych negative neurological ROS  negative psych ROS   GI/Hepatic Neg liver ROS,GERD  ,,  Endo/Other  negative endocrine ROS    Renal/GU negative Renal ROS  negative genitourinary   Musculoskeletal   Abdominal   Peds  Hematology negative hematology ROS (+)   Anesthesia Other Findings Past Medical History: No date: GERD (gastroesophageal reflux disease) No date: Hypertension No date: Squamous cell skin cancer   Reproductive/Obstetrics negative OB ROS                             Anesthesia Physical Anesthesia Plan  ASA: 2  Anesthesia Plan: General   Post-op Pain Management:    Induction: Intravenous  PONV Risk Score and Plan: 3 and Propofol infusion and TIVA  Airway Management Planned: Natural Airway and Nasal Cannula  Additional Equipment:   Intra-op Plan:   Post-operative Plan:   Informed Consent: I have reviewed the patients History and Physical, chart, labs and discussed the procedure including the risks, benefits and alternatives for the proposed anesthesia with the patient or authorized representative who has  indicated his/her understanding and acceptance.     Dental Advisory Given  Plan Discussed with: Anesthesiologist, CRNA and Surgeon  Anesthesia Plan Comments:        Anesthesia Quick Evaluation

## 2022-05-26 NOTE — Op Note (Signed)
[facility] Gastroenterology Patient Name: Lori Dougherty Procedure Date: 05/26/2022 10:52 AM MRN: [id number] Account #: [id number] Date of Birth: Oct 28, 1950 Admit Type: Outpatient Age: 71 Room: [facility] ENDO ROOM 4 Gender: Female Note Status: Finalized Instrument Name: Upper Endoscope [id number] Procedure:             Upper GI endoscopy Indications:           Dyspepsia, Heartburn Providers:             Lin Landsman MD, MD Medicines:             General Anesthesia Complications:         No immediate complications. Estimated blood loss: None. Procedure:             Pre-Anesthesia Assessment:                        - Prior to the procedure, a History and Physical was                         performed, and patient medications and allergies were                         reviewed. The patient is competent. The risks and                         benefits of the procedure and the sedation options and                         risks were discussed with the patient. All questions                         were answered and informed consent was obtained.                         Patient identification and proposed procedure were                         verified by the physician, the nurse, the                         anesthesiologist, the anesthetist and the technician                         in the pre-procedure area in the procedure room in the                         endoscopy suite. Mental Status Examination: alert and                         oriented. Airway Examination: normal oropharyngeal                         airway and neck mobility. Respiratory Examination:                         clear to auscultation. CV Examination: normal.                         Prophylactic Antibiotics: The  patient does not require                         prophylactic antibiotics. Prior Anticoagulants: The                         patient has taken no anticoagulant or antiplatelet                          agents. ASA Grade Assessment: II - A patient with mild                         systemic disease. After reviewing the risks and                         benefits, the patient was deemed in satisfactory                         condition to undergo the procedure. The anesthesia                         plan was to use general anesthesia. Immediately prior                         to administration of medications, the patient was                         re-assessed for adequacy to receive sedatives. The                         heart rate, respiratory rate, oxygen saturations,                         blood pressure, adequacy of pulmonary ventilation, and                         response to care were monitored throughout the                         procedure. The physical status of the patient was                         re-assessed after the procedure.                        After obtaining informed consent, the endoscope was                         passed under direct vision. Throughout the procedure,                         the patient's blood pressure, pulse, and oxygen                         saturations were monitored continuously. The Endoscope                         was introduced through the mouth, and advanced to the  second part of duodenum. The upper GI endoscopy was                         accomplished without difficulty. The patient tolerated                         the procedure well. Findings:      The examined duodenum was normal.      The entire examined stomach was normal. Biopsies were taken with a cold       forceps for Helicobacter pylori testing.      The cardia and gastric fundus were normal on retroflexion.      Esophagogastric landmarks were identified: the gastroesophageal junction       was found at 35 cm from the incisors.      The gastroesophageal junction and examined esophagus were normal. Impression:            - Normal examined  duodenum.                        - Normal stomach. Biopsied.                        - Esophagogastric landmarks identified.                        - Normal gastroesophageal junction and esophagus. Recommendation:        - Discharge patient to home (with escort).                        - Resume previous diet today.                        - Continue present medications.                        - Await pathology results. Procedure Code(s):     --- Professional ---                        [phone], Esophagogastroduodenoscopy, flexible,                         transoral; with biopsy, single or multiple Diagnosis Code(s):     --- Professional ---                        R10.13, Epigastric pain                        R12, Heartburn CPT copyright 2022 [employer] Association. All rights reserved. The codes documented in this report are preliminary and upon coder review may  be revised to meet current compliance requirements. Dr. Ulyess Mort Lin Landsman MD, MD 05/26/2022 11:04:10 AM This report has been signed electronically. Number of Addenda: 0 Note Initiated On: 05/26/2022 10:52 AM Estimated Blood Loss:  Estimated blood loss: none.      [facility]

## 2022-05-27 ENCOUNTER — Encounter: Payer: Self-pay | Admitting: Gastroenterology

## 2022-05-27 ENCOUNTER — Telehealth: Payer: Self-pay

## 2022-05-27 LAB — SURGICAL PATHOLOGY

## 2022-05-27 MED ORDER — OMEPRAZOLE 40 MG PO CPDR: 40.0000 mg | DELAYED_RELEASE_CAPSULE | Freq: Every day | ORAL | 0 refills | Status: AC

## 2022-05-27 NOTE — Telephone Encounter (Signed)
Patient verbalized understanding of results. She states today she in more pain her stomach and side. She states she will increase the medication to '40mg'$ . She states she does not want [place] to send her a prescription of the medication because she has a large amount of the medication from [facility]. She states if she runs out she will contact them and they will send her more.

## 2022-05-27 NOTE — Telephone Encounter (Signed)
-----   Message from Lin Landsman, MD sent at 05/27/2022  2:19 PM EST ----- Please inform patient that pathology results from upper endoscopy came back normal.  Since she had a CT scan of her abdomen in October 2022, I do not recommend a repeat CT scan at this time.  Recommend to increase omeprazole to 40 mg once a day for 4 weeks.  If her symptoms are persistent after 1 month of trial of omeprazole 40 mg daily, she should let us know  Rohini Vanga

## 2022-06-07 NOTE — Anesthesia Postprocedure Evaluation (Signed)
Anesthesia Post Note  Patient: Dupont  Procedure(s) Performed: ESOPHAGOGASTRODUODENOSCOPY (EGD) WITH PROPOFOL  Patient location during evaluation: Endoscopy Anesthesia Type: General Level of consciousness: awake and alert Pain management: pain level controlled Vital Signs Assessment: post-procedure vital signs reviewed and stable Respiratory status: spontaneous breathing, nonlabored ventilation, respiratory function stable and patient connected to nasal cannula oxygen Cardiovascular status: blood pressure returned to baseline and stable Postop Assessment: no apparent nausea or vomiting Anesthetic complications: no   No notable events documented.   Last Vitals:  Vitals:   05/26/22 1105 05/26/22 1116  BP: 139/81 138/61  Pulse: 84 66  Resp: 16 14  Temp: 36.4 C   SpO2: 99% 100%    Last Pain:  Vitals:   05/27/22 0753  TempSrc:   PainSc: 0-No pain                 Martha Clan

## 2022-09-08 ENCOUNTER — Other Ambulatory Visit: Payer: Self-pay | Admitting: Physician Assistant

## 2022-09-08 DIAGNOSIS — Z780 Asymptomatic menopausal state: Secondary | ICD-10-CM

## 2022-09-08 DIAGNOSIS — Z1231 Encounter for screening mammogram for malignant neoplasm of breast: Secondary | ICD-10-CM

## 2022-10-06 ENCOUNTER — Ambulatory Visit: Payer: Medicare Other

## 2022-10-06 ENCOUNTER — Other Ambulatory Visit: Payer: Medicare Other

## 2022-10-06 ENCOUNTER — Ambulatory Visit
Admission: RE | Admit: 2022-10-06 | Discharge: 2022-10-06 | Disposition: A | Discharge status: Home / Self Care | Payer: Medicare Other | Source: Ambulatory Visit | Attending: Physician Assistant | Admitting: Physician Assistant

## 2022-10-06 DIAGNOSIS — Z780 Asymptomatic menopausal state: Secondary | ICD-10-CM | POA: Insufficient documentation

## 2022-10-06 DIAGNOSIS — Z1231 Encounter for screening mammogram for malignant neoplasm of breast: Secondary | ICD-10-CM | POA: Diagnosis not present

## 2023-02-15 ENCOUNTER — Other Ambulatory Visit: Payer: Self-pay | Admitting: Physician Assistant

## 2023-02-15 DIAGNOSIS — Z5181 Encounter for therapeutic drug level monitoring: Secondary | ICD-10-CM

## 2023-02-15 DIAGNOSIS — M810 Age-related osteoporosis without current pathological fracture: Secondary | ICD-10-CM

## 2023-09-02 ENCOUNTER — Other Ambulatory Visit: Payer: Self-pay | Admitting: Family Medicine

## 2023-09-02 DIAGNOSIS — Z1231 Encounter for screening mammogram for malignant neoplasm of breast: Secondary | ICD-10-CM

## 2023-10-10 ENCOUNTER — Ambulatory Visit
Admission: RE | Admit: 2023-10-10 | Discharge: 2023-10-10 | Disposition: A | Discharge status: Home / Self Care | Payer: Medicare Other | Source: Ambulatory Visit | Attending: Family Medicine | Admitting: Family Medicine

## 2023-10-10 DIAGNOSIS — Z1231 Encounter for screening mammogram for malignant neoplasm of breast: Secondary | ICD-10-CM | POA: Diagnosis present

## 2024-01-03 ENCOUNTER — Other Ambulatory Visit: Payer: Self-pay | Admitting: Physician Assistant

## 2024-01-03 DIAGNOSIS — M810 Age-related osteoporosis without current pathological fracture: Secondary | ICD-10-CM

## 2024-01-03 DIAGNOSIS — Z5181 Encounter for therapeutic drug level monitoring: Secondary | ICD-10-CM

## 2024-02-14 ENCOUNTER — Ambulatory Visit
Admission: EM | Admit: 2024-02-14 | Discharge: 2024-02-14 | Disposition: A | Discharge status: Home / Self Care | Attending: Physician Assistant | Admitting: Physician Assistant

## 2024-02-14 DIAGNOSIS — H1033 Unspecified acute conjunctivitis, bilateral: Secondary | ICD-10-CM | POA: Diagnosis not present

## 2024-02-14 MED ORDER — MOXIFLOXACIN HCL 0.5 % OP SOLN: 1.0000 [drp] | Freq: Three times a day (TID) | OPHTHALMIC | 0 refills | Status: AC

## 2024-02-14 NOTE — Discharge Instructions (Addendum)
-  Possible bacterial pink eye -Apply cool compresses and take OTC redness relief eye drops -Begin antibiotic eye drops -F/u with eye doctor if not improving in a few days

## 2024-02-14 NOTE — ED Triage Notes (Signed)
 Patient states that she has had he sx for 3 days. Patient states that both eyes are red, itchy and crusty.

## 2024-02-14 NOTE — ED Provider Notes (Signed)
 [facility]    CSN: [id number] Arrival date & time: 02/14/24  1412      History   Chief Complaint Chief Complaint  Patient presents with   Conjunctivitis    HPI Haevyn Ury is a 73 y.o. female with history of glaucoma and pseudophakia of bilateral eyes.  She presents today for bilateral eyes redness, itching/irritation and yellow crusting x 3 days.  Has recently been traveling a lot and thinks she might of picked up he got from someone.  She denies pain, visual disturbance, photophobia/light sensitivity, headaches or dizziness, fatigue.  Does not wear contacts.  Denies injury to the eye or foreign body.  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   Hyperlipidemia 08/23/2018   Hypertension 08/23/2018   Psoriasis 08/23/2018   Gross hematuria 08/23/2018   Personal history of tobacco use 09/28/2017   Pulmonary nodule, left 09/28/2017   Family history of colon cancer 04/08/2015   Gastroesophageal reflux disease 04/08/2015    Past Surgical History:  Procedure Laterality Date   ABDOMINAL HYSTERECTOMY     COLONOSCOPY WITH PROPOFOL      ESOPHAGOGASTRODUODENOSCOPY (EGD) WITH PROPOFOL  N/A 05/26/2022   Procedure: ESOPHAGOGASTRODUODENOSCOPY (EGD) WITH PROPOFOL ;  Surgeon: Unk Corinn Skiff, MD;  Location: [facility] ENDOSCOPY;  Service: Gastroenterology;  Laterality: N/A;    OB History   No obstetric history on file.      Home Medications    Prior to Admission medications   Medication Sig Start Date End Date Taking? Authorizing Provider  atorvastatin (LIPITOR) 40 MG tablet Take 1 tablet by mouth daily. 09/04/21  Yes [provider]  dorzolamide-timolol (COSOPT) 2-0.5  % ophthalmic solution Place 1 drop into both eyes 2 (two) times daily. 11/09/21  Yes [provider]  latanoprost (XALATAN) 0.005 % ophthalmic solution Place 1 drop into both eyes at bedtime. 11/11/21  Yes [provider]  lisinopril (ZESTRIL) 30 MG tablet Take 30 mg by mouth daily. 07/27/21  Yes [provider]  moxifloxacin  (VIGAMOX ) 0.5 % ophthalmic solution Place 1 drop into both eyes 3 (three) times daily for 7 days. 02/14/24 02/21/24 Yes Arvis Jolan NOVAK, PA-C  albuterol  (VENTOLIN  HFA) 108 (90 Base) MCG/ACT inhaler Inhale 1-2 puffs into the lungs every 6 (six) hours as needed for wheezing or shortness of breath. 09/10/21   Merilee Andrea CROME, NP  azelastine (ASTELIN) 0.1 % nasal spray Place into the nose. 07/22/21 07/22/22  [provider]  Cholecalciferol (VITAMIN D3) 25 MCG (1000 UT) CAPS Take by mouth.    [provider]  fluticasone (FLONASE) 50 MCG/ACT nasal spray Place 2 sprays into both nostrils daily. 07/22/21 07/22/22  [provider]  lisinopril-hydrochlorothiazide (PRINZIDE,ZESTORETIC) 20-12.5 MG tablet Take 1 tablet by mouth daily.    [provider]  omeprazole  (PRILOSEC) 40 MG capsule Take 1 capsule (40 mg total) by mouth daily before breakfast. 05/27/22   Vanga, Rohini Reddy, MD  Oxymetazoline HCl 1 % CREA Apply topically. 11/25/21   [provider]  pravastatin (PRAVACHOL) 40 MG tablet Take 40 mg by mouth daily.    [provider]  raloxifene (EVISTA) 60 MG tablet TK 1 T PO ONCE D FOR OSTEOPOROSIS 04/19/18  [provider]  Saline GEL Apply topically. 09/06/16   [provider]  traZODone (DESYREL) 150 MG tablet Take 150 mg by mouth at bedtime. 11/10/21   [provider]  tretinoin (RETIN-A) 0.05 % cream Apply topically at bedtime. 12/22/20   [provider]    Family History Family History  Problem Relation Age of Onset   Breast cancer Neg Hx     Social History Social History    Tobacco Use   Smoking status: Former    Current packs/day: 0.00    Types: Cigarettes    Quit date: 01/02/2007    Years since quitting: 17.1   Smokeless tobacco: Never  Vaping Use   Vaping status: Never Used  Substance Use Topics   Alcohol use: Yes    Comment: couple times weekly   Drug use: No     Allergies   Patient has no known allergies.   Review of Systems Review of Systems  Constitutional:  Negative for fatigue and fever.  HENT:  Negative for congestion and facial swelling.   Eyes:  Positive for discharge, redness and itching. Negative for photophobia, pain and visual disturbance.  Allergic/Immunologic: Negative for environmental allergies.  Neurological:  Negative for dizziness and headaches.     Physical Exam Triage Vital Signs ED Triage Vitals  Encounter Vitals Group     BP 02/14/24 1509 (!) 149/67     Girls Systolic BP Percentile --      Girls Diastolic BP Percentile --      Boys Systolic BP Percentile --      Boys Diastolic BP Percentile --      Pulse Rate 02/14/24 1509 (!) 55     Resp 02/14/24 1509 17     Temp 02/14/24 1509 98.5 F (36.9 C)     Temp Source 02/14/24 1509 Oral     SpO2 02/14/24 1509 96 %     Weight --      Height --      Head Circumference --      Peak Flow --      Pain Score 02/14/24 1508 4     Pain Loc --      Pain Education --      Exclude from Growth Chart --    No data found.  Updated Vital Signs BP (!) 149/67 (BP Location: Right Arm)   Pulse (!) 55   Temp 98.5 F (36.9 C) (Oral)   Resp 17   SpO2 96%   Visual Acuity Right Eye Distance: 20/30 Left Eye Distance: 20/30 Bilateral Distance: 20/25  Physical Exam Vitals and nursing note reviewed.  Constitutional:      General: She is not in acute distress.    Appearance: Normal appearance. She is not ill-appearing or toxic-appearing.  HENT:     Head: Normocephalic and atraumatic.     Nose: Nose normal.     Mouth/Throat:     Mouth: Mucous membranes are moist.      Pharynx: Oropharynx is clear.  Eyes:     General: No scleral icterus.       Right eye: No discharge.        Left eye: No discharge.     Conjunctiva/sclera:     Right eye: Right conjunctiva is injected.     Left eye: Left conjunctiva is injected.  Cardiovascular:     Rate and Rhythm: Bradycardia present.  Pulmonary:     Effort: Pulmonary effort is normal. No respiratory distress.  Musculoskeletal:  Cervical back: Neck supple.  Skin:    General: Skin is dry.  Neurological:     General: No focal deficit present.     Mental Status: She is alert. Mental status is at baseline.     Motor: No weakness.     Gait: Gait normal.  Psychiatric:        Mood and Affect: Mood normal.        Behavior: Behavior normal.      UC Treatments / Results  Labs (all labs ordered are listed, but only abnormal results are displayed) Labs Reviewed - No data to display  EKG   Radiology No results found.  Procedures Procedures (including critical care time)  Medications Ordered in UC Medications - No data to display  Initial Impression / Assessment and Plan / UC Course  I have reviewed the triage vital signs and the nursing notes.  Pertinent labs & imaging results that were available during my care of the patient were reviewed by me and considered in my medical decision making (see chart for details).   73 year old female with history of glaucoma presents for bilateral eye redness, itching/irritation, crusting x 3 days.  No pain or URI symptoms.  Presentation consistent with acute conjunctivitis, likely bacterial.  Treating at this time with Vigamox .  Also advised cool compresses, OTC drops.  Reviewed following up with her eye doctor if not improving in a few days.   Final Clinical Impressions(s) / UC Diagnoses   Final diagnoses:  Acute conjunctivitis of both eyes, unspecified acute conjunctivitis type     Discharge Instructions      -Possible bacterial pink eye -Apply cool  compresses and take OTC redness relief eye drops -Begin antibiotic eye drops -F/u with eye doctor if not improving in a few days   ED Prescriptions     Medication Sig Dispense Auth. Provider   moxifloxacin  (VIGAMOX ) 0.5 % ophthalmic solution Place 1 drop into both eyes 3 (three) times daily for 7 days. 3 mL Arvis Jolan NOVAK, PA-C      PDMP not reviewed this encounter.   Arvis Jolan NOVAK, PA-C 02/14/24 1540

## 2024-06-20 ENCOUNTER — Ambulatory Visit
Admission: RE | Admit: 2024-06-20 | Discharge: 2024-06-20 | Disposition: A | Source: Ambulatory Visit | Attending: Physician Assistant | Admitting: Physician Assistant

## 2024-06-20 DIAGNOSIS — Z7983 Long term (current) use of bisphosphonates: Secondary | ICD-10-CM | POA: Diagnosis present

## 2024-06-20 DIAGNOSIS — Z5181 Encounter for therapeutic drug level monitoring: Secondary | ICD-10-CM | POA: Diagnosis present

## 2024-06-20 DIAGNOSIS — M810 Age-related osteoporosis without current pathological fracture: Secondary | ICD-10-CM | POA: Diagnosis present
